# Patient Record
Sex: Female | Born: 1965 | Race: Black or African American | Hispanic: No | Marital: Single | Smoking: Never smoker
Health system: Southern US, Community
[De-identification: ages and names within clinical notes are randomized; demographics above are authoritative.]

## PROBLEM LIST (undated history)

## (undated) DIAGNOSIS — IMO0002 Reserved for concepts with insufficient information to code with codable children: Secondary | ICD-10-CM

## (undated) DIAGNOSIS — I639 Cerebral infarction, unspecified: Secondary | ICD-10-CM

## (undated) DIAGNOSIS — R001 Bradycardia, unspecified: Secondary | ICD-10-CM

## (undated) HISTORY — PX: ANKLE SURGERY: SHX546

## (undated) HISTORY — PX: OTHER SURGICAL HISTORY: SHX169

---

## 1998-11-06 ENCOUNTER — Emergency Department (HOSPITAL_COMMUNITY): Admission: EM | Admit: 1998-11-06 | Discharge: 1998-11-06 | Payer: Self-pay | Admitting: Emergency Medicine

## 1998-11-18 ENCOUNTER — Encounter: Admission: RE | Admit: 1998-11-18 | Discharge: 1998-11-18 | Payer: Self-pay | Admitting: Family Medicine

## 2010-11-26 ENCOUNTER — Other Ambulatory Visit: Payer: Self-pay

## 2010-11-26 ENCOUNTER — Observation Stay (HOSPITAL_COMMUNITY)
Admission: EM | Admit: 2010-11-26 | Discharge: 2010-11-27 | Disposition: A | Discharge status: Home / Self Care | Payer: Self-pay | Attending: Emergency Medicine | Admitting: Emergency Medicine

## 2010-11-26 ENCOUNTER — Encounter: Payer: Self-pay | Admitting: *Deleted

## 2010-11-26 ENCOUNTER — Emergency Department (HOSPITAL_COMMUNITY): Payer: Self-pay

## 2010-11-26 DIAGNOSIS — I4581 Long QT syndrome: Secondary | ICD-10-CM | POA: Insufficient documentation

## 2010-11-26 DIAGNOSIS — R799 Abnormal finding of blood chemistry, unspecified: Secondary | ICD-10-CM | POA: Insufficient documentation

## 2010-11-26 DIAGNOSIS — R51 Headache: Principal | ICD-10-CM | POA: Insufficient documentation

## 2010-11-26 DIAGNOSIS — R072 Precordial pain: Secondary | ICD-10-CM

## 2010-11-26 DIAGNOSIS — R071 Chest pain on breathing: Secondary | ICD-10-CM | POA: Insufficient documentation

## 2010-11-26 HISTORY — DX: Reserved for concepts with insufficient information to code with codable children: IMO0002

## 2010-11-26 LAB — URINALYSIS, ROUTINE W REFLEX MICROSCOPIC
Ketones, ur: NEGATIVE mg/dL
Protein, ur: NEGATIVE mg/dL
Urobilinogen, UA: 1 mg/dL (ref 0.0–1.0)

## 2010-11-26 LAB — COMPREHENSIVE METABOLIC PANEL
ALT: 7 U/L (ref 0–35)
CO2: 25 mEq/L (ref 19–32)
Calcium: 9.7 mg/dL (ref 8.4–10.5)
Creatinine, Ser: 0.97 mg/dL (ref 0.50–1.10)
GFR calc Af Amer: 82 mL/min — ABNORMAL LOW (ref >90)
GFR calc non Af Amer: 71 mL/min — ABNORMAL LOW (ref >90)
Glucose, Bld: 82 mg/dL (ref 70–99)

## 2010-11-26 LAB — DIFFERENTIAL
Basophils Absolute: 0 10*3/uL (ref 0.0–0.1)
Eosinophils Absolute: 0.1 10*3/uL (ref 0.0–0.7)
Lymphs Abs: 1.9 10*3/uL (ref 0.7–4.0)
Neutro Abs: 1.6 10*3/uL — ABNORMAL LOW (ref 1.7–7.7)

## 2010-11-26 LAB — TROPONIN I: Troponin I: <0.3 ng/mL (ref <0.30)

## 2010-11-26 LAB — CK TOTAL AND CKMB (NOT AT ARMC)
CK, MB: 2.3 ng/mL (ref 0.3–4.0)
Relative Index: INVALID (ref 0.0–2.5)
Total CK: 85 U/L (ref 7–177)

## 2010-11-26 LAB — CBC
HCT: 32.1 % — ABNORMAL LOW (ref 36.0–46.0)
MCHC: 29.9 g/dL — ABNORMAL LOW (ref 30.0–36.0)
MCV: 72.3 fL — ABNORMAL LOW (ref 78.0–100.0)
RDW: 20.4 % — ABNORMAL HIGH (ref 11.5–15.5)

## 2010-11-26 LAB — POCT I-STAT TROPONIN I

## 2010-11-26 LAB — URINE MICROSCOPIC-ADD ON

## 2010-11-26 MED ORDER — DIPHENHYDRAMINE HCL 25 MG PO CAPS
25.0000 mg | ORAL_CAPSULE | Freq: Once | ORAL | Status: AC
  Administered 2010-11-26: 25 mg via ORAL
  Filled 2010-11-26: qty 1

## 2010-11-26 MED ORDER — NITROGLYCERIN 0.4 MG SL SUBL
0.4000 mg | SUBLINGUAL_TABLET | SUBLINGUAL | Status: DC | PRN
  Administered 2010-11-26: 0.4 mg via SUBLINGUAL

## 2010-11-26 MED ORDER — METOCLOPRAMIDE HCL 5 MG/ML IJ SOLN
INTRAMUSCULAR | Status: AC
  Administered 2010-11-26: 10 mg via INTRAVENOUS
  Filled 2010-11-26: qty 2

## 2010-11-26 MED ORDER — MORPHINE SULFATE 2 MG/ML IJ SOLN
2.0000 mg | Freq: Once | INTRAMUSCULAR | Status: AC
  Administered 2010-11-26: 2 mg via INTRAVENOUS

## 2010-11-26 MED ORDER — DEXAMETHASONE SODIUM PHOSPHATE 10 MG/ML IJ SOLN
10.0000 mg | Freq: Once | INTRAMUSCULAR | Status: AC
  Administered 2010-11-26: 10 mg via INTRAVENOUS
  Filled 2010-11-26: qty 1

## 2010-11-26 MED ORDER — METOCLOPRAMIDE HCL 5 MG/ML IJ SOLN
10.0000 mg | Freq: Once | INTRAMUSCULAR | Status: AC
  Administered 2010-11-26: 10 mg via INTRAVENOUS

## 2010-11-26 MED ORDER — IOHEXOL 300 MG/ML  SOLN
100.0000 mL | Freq: Once | INTRAMUSCULAR | Status: AC | PRN
  Administered 2010-11-26: 100 mL via INTRAVENOUS

## 2010-11-26 MED ORDER — MORPHINE SULFATE 4 MG/ML IJ SOLN
INTRAMUSCULAR | Status: AC
  Filled 2010-11-26: qty 1

## 2010-11-26 MED ORDER — ASPIRIN 81 MG PO CHEW: 324.0000 mg | CHEWABLE_TABLET | Freq: Once | ORAL | Status: DC

## 2010-11-26 MED ORDER — METOCLOPRAMIDE HCL 5 MG/ML IJ SOLN
10.0000 mg | Freq: Once | INTRAMUSCULAR | Status: AC
  Administered 2010-11-26: 10 mg via INTRAVENOUS
  Filled 2010-11-26: qty 2

## 2010-11-26 MED ORDER — SODIUM CHLORIDE 0.9 % IV SOLN
INTRAVENOUS | Status: DC
  Administered 2010-11-26: 08:00:00 via INTRAVENOUS

## 2010-11-26 NOTE — ED Notes (Signed)
Pt to cdu on cp protocol. C/o substernal cp x 1 day. Denies sob. Pt was c/o h/a earlier but now has resolved. Pt states cp remains 4/10. Pt to have stress test in am. In no acute distress, a&ox3.

## 2010-11-26 NOTE — ED Provider Notes (Signed)
History     CSN: [id number] Arrival date & time: 11/26/2010  6:33 AM   None     Chief Complaint  Patient presents with  . Headache    (Consider location/radiation/quality/duration/timing/severity/associated sxs/prior treatment) HPI Comments: Patient is a 45 year old woman who developed chest pain around 2 AM today. She says the pain is in her left anterior chest as a constant dull ache. She rates the pain as a 7 on the pain scale. There has been no prior episode. She took no medication for this. She says that she's also had a headache in the temples for the past 3 days. The throbbing headache. She has taken Advil for migraine without relief. She's had prior similar episodes.  Patient is a 45 y.o. female presenting with chest pain. The history is provided by the patient. No language interpreter was used.  Chest Pain The chest pain began 3 - 5 hours ago. Episode Length: Patient has a constant dull pain. Chest pain occurs constantly. The chest pain is unchanged. At its most intense, the pain is at 7/10. The severity of the pain is moderate. The quality of the pain is described as aching. The pain does not radiate. She tried nothing for the symptoms. Past medical history comments: Patient has had prior surgery for a bleeding ulcer. Family history comments: Patient's mother had hypertension and diabetes. Her father died of leukemia. She has 3 sisters and 2 brothers living and well.     Past Medical History  Diagnosis Date  . Ulcer     History reviewed. No pertinent past surgical history.  History reviewed. No pertinent family history.  History  Substance Use Topics  . Smoking status: Never Smoker   . Smokeless tobacco: Not on file  . Alcohol Use: No    OB History    Grav Para Term Preterm Abortions TAB SAB Ect Mult Living                  Review of Systems  Constitutional: Negative.   Eyes: Negative.   Respiratory: Negative.   Cardiovascular: Positive for chest pain.    Gastrointestinal: Negative.   Genitourinary: Negative.        She just finished a normal menstrual period.  Musculoskeletal: Negative.   Neurological: Positive for headaches.  Psychiatric/Behavioral: Negative.     Allergies  Review of patient's allergies indicates no known allergies.  Home Medications  No current outpatient prescriptions on file.  BP 96/53  Pulse 84  Temp(Src) 98.2 F (36.8 C) (Oral)  Resp 20  SpO2 100%  Physical Exam  Constitutional: She is oriented to person, place, and time. She appears well-developed and well-nourished. Distressed: in moderate distress with chest pain.  HENT:  Head: Normocephalic and atraumatic.  Mouth/Throat: No oropharyngeal exudate.       She localizes her headache to the temporal areas. There is no palpable skull the formerly. There is no point tenderness over the temporal arteries.  Eyes: Conjunctivae and EOM are normal. Pupils are equal, round, and reactive to light. No scleral icterus.  Neck: Normal range of motion. Neck supple. No thyromegaly present.  Cardiovascular: Normal rate, regular rhythm and normal heart sounds.   Pulmonary/Chest: Effort normal and breath sounds normal.  Abdominal: Soft. Bowel sounds are normal.  Musculoskeletal: Normal range of motion.       No calf tenderness. No Homans sign.  Lymphadenopathy:    She has no cervical adenopathy.  Neurological: She is alert and oriented to person, place, and time.  She has normal reflexes.       No sensory or motor deficits.  Skin: Skin is warm and dry.  Psychiatric: She has a normal mood and affect. Her behavior is normal.    ED Course  Procedures (including critical care time)  7:20 PM Patient was seen and had physical examination, which was entirely normal. Electrocardiogram showed ventricular bigeminy. There is no acute change. Laboratory tests and chest x-ray were ordered. IV fluids, oxygen, sublingual nitroglycerin were ordered.   Date: 11/26/2010  Rate:72   Rhythm: sinus tachycardia with bigeminy  QRS Axis: normal  Intervals: QT prolonged  ST/T Wave abnormalities: normal  Conduction Disutrbances:none  Narrative Interpretation: Abnormal EKG, ventricular bigeminy.  Old EKG Reviewed: Had ventricular bigeminy today.  7:20 PM Troponin I was negative. D-dimer was elevated at 1.32. CT angiogram chest was ordered to check for pulmonary embolism.  7:20 PM CT angiogram chest was negative. Patient was informed of this. She continues with chest pain and headache. I will give her migraine cocktail of Reglan, dexamethasone, and Benadryl. The three-hour troponin I was ordered also.  7:20 PM Waiting for 3 hour TNI result.  7:20 PM Second TNI is back and is negative.  She still rates her pain at a 4.  Will move to CDU observation for chest pain. I discussed this with pt and her husband and they are agreeable with this approach.   Results for orders placed during the hospital encounter of 11/26/10  CBC      Component Value Range   WBC 3.9 (*) 4.0 - 10.5 (K/uL)   RBC 4.44  3.87 - 5.11 (MIL/uL)   Hemoglobin 9.6 (*) 12.0 - 15.0 (g/dL)   HCT 16.1 (*) 09.6 - 46.0 (%)   MCV 72.3 (*) 78.0 - 100.0 (fL)   MCH 21.6 (*) 26.0 - 34.0 (pg)   MCHC 29.9 (*) 30.0 - 36.0 (g/dL)   RDW 04.5 (*) 40.9 - 15.5 (%)   Platelets 254  150 - 400 (K/uL)  DIFFERENTIAL      Component Value Range   Neutrophils Relative 42 (*) 43 - 77 (%)   Lymphocytes Relative 47 (*) 12 - 46 (%)   Monocytes Relative 8  3 - 12 (%)   Eosinophils Relative 2  0 - 5 (%)   Basophils Relative 1  0 - 1 (%)   Neutro Abs 1.6 (*) 1.7 - 7.7 (K/uL)   Lymphs Abs 1.9  0.7 - 4.0 (K/uL)   Monocytes Absolute 0.3  0.1 - 1.0 (K/uL)   Eosinophils Absolute 0.1  0.0 - 0.7 (K/uL)   Basophils Absolute 0.0  0.0 - 0.1 (K/uL)   RBC Morphology ELLIPTOCYTES     WBC Morphology ATYPICAL LYMPHOCYTES     Smear Review PLATELETS APPEAR ADEQUATE    COMPREHENSIVE METABOLIC PANEL      Component Value Range   Sodium 138  135  - 145 (mEq/L)   Potassium 4.0  3.5 - 5.1 (mEq/L)   Chloride 106  96 - 112 (mEq/L)   CO2 25  19 - 32 (mEq/L)   Glucose, Bld 82  70 - 99 (mg/dL)   BUN 16  6 - 23 (mg/dL)   Creatinine, Ser 8.11  0.50 - 1.10 (mg/dL)   Calcium 9.7  8.4 - 91.4 (mg/dL)   Total Protein 7.6  6.0 - 8.3 (g/dL)   Albumin 3.4 (*) 3.5 - 5.2 (g/dL)   AST 19  0 - 37 (U/L)   ALT 7  0 - 35 (U/L)  Alkaline Phosphatase 70  39 - 117 (U/L)   Total Bilirubin 0.3  0.3 - 1.2 (mg/dL)   GFR calc non Af Amer 71 (*) >90 (mL/min)   GFR calc Af Amer 82 (*) >90 (mL/min)  D-DIMER, QUANTITATIVE      Component Value Range   D-Dimer, Quant 1.32 (*) 0.00 - 0.48 (ug/mL-FEU)  URINALYSIS, ROUTINE W REFLEX MICROSCOPIC      Component Value Range   Color, Urine YELLOW  YELLOW    Appearance CLEAR  CLEAR    Specific Gravity, Urine 1.017  1.005 - 1.030    pH 5.5  5.0 - 8.0    Glucose, UA NEGATIVE  NEGATIVE (mg/dL)   Hgb urine dipstick SMALL (*) NEGATIVE    Bilirubin Urine NEGATIVE  NEGATIVE    Ketones, ur NEGATIVE  NEGATIVE (mg/dL)   Protein, ur NEGATIVE  NEGATIVE (mg/dL)   Urobilinogen, UA 1.0  0.0 - 1.0 (mg/dL)   Nitrite NEGATIVE  NEGATIVE    Leukocytes, UA SMALL (*) NEGATIVE   CK TOTAL AND CKMB      Component Value Range   Total CK 85  7 - 177 (U/L)   CK, MB 2.3  0.3 - 4.0 (ng/mL)   Relative Index RELATIVE INDEX IS INVALID  0.0 - 2.5   URINE MICROSCOPIC-ADD ON      Component Value Range   Squamous Epithelial / LPF FEW (*) RARE    WBC, UA 3-6  <3 (WBC/hpf)   Bacteria, UA RARE  RARE   POCT I-STAT TROPONIN I      Component Value Range   Troponin i, poc 0.00  0.00 - 0.08 (ng/mL)   Comment 3           POCT I-STAT TROPONIN I      Component Value Range   Troponin i, poc 0.00  0.00 - 0.08 (ng/mL)   Comment 3           TROPONIN I      Component Value Range   Troponin I <0.30  <0.30 (ng/mL)   Ct Angio Chest W/cm &/or Wo Cm  11/26/2010  *RADIOLOGY REPORT*  Clinical Data:  Left-sided chest pain, elevated D-dimer  CT ANGIOGRAPHY  CHEST WITH CONTRAST  Technique:  Multidetector CT imaging of the chest was performed using the standard protocol during bolus administration of intravenous contrast.  Multiplanar CT image reconstructions including MIPs were obtained to evaluate the vascular anatomy.  Contrast: OMNIPAQUE IOHEXOL 300 MG/ML IV SOLN  Comparison:  Portable chest x-ray of 11/26/2010  Findings:  The pulmonary arteries opacify well and there is no evidence of acute pulmonary embolism.  The thoracic aorta is not as well opacified but no acute abnormality is seen.  No mediastinal or hilar adenopathy is seen.  No axillary adenopathy is noted.  On the lung window images, no lung parenchymal abnormality is seen. No lung nodule is noted.  There is no evidence of pleural effusion. No bony abnormality is seen.  Review of the MIP images confirms the above findings.  IMPRESSION: Negative CT angiogram of the chest.  No evidence of acute pulmonary embolism.  Original Report Authenticated By: Juline Patch, M.D.   Dg Chest Port 1 View  11/26/2010  *RADIOLOGY REPORT*  Clinical Data: Left-sided chest pain today  PORTABLE CHEST - 1 VIEW  Comparison: None.  Findings: The lungs are clear.  Mediastinal contours appear normal. The heart is within normal limits in size.  No bony abnormality is seen.  IMPRESSION: No  active lung disease.  Original Report Authenticated By: Juline Patch, M.D.   1. Chest pain            Carleene Cooper III, MD 11/26/10 [phone]

## 2010-11-26 NOTE — ED Notes (Signed)
Pt reports no relief after nitro. Blood pressure <100 will not give 2nd nitro.

## 2010-11-26 NOTE — ED Notes (Signed)
Pt reports headache and chest pain 6/10, edp is aware of meds given, pt remains on monitor

## 2010-11-26 NOTE — ED Notes (Signed)
Patient presents to ed c/o headache onset 4 days ago, states she was awakened with chest pain this am . Pain radiates across her anterior chest. No change with movement or breathing.

## 2010-11-26 NOTE — ED Notes (Signed)
Pt presents to department for evaluation of throbbing headache and nausea x4 days. No relief with medications at home. Also states she woke up with L sided chest pain radiating to L arm around 02:00 this morning. Nothing makes pain worse. Denies SOB. Respirations unlabored. 4/10 pain at the time. Ambulatory without difficulty. Pt is conscious alert and oriented x4. No signs of distress at the time.

## 2010-11-26 NOTE — ED Notes (Signed)
Pt reports left sided chest pain associated with headache, pain is non radiating. Pt remains on monitor. Aware of poc. Family at bedside.

## 2010-11-26 NOTE — ED Provider Notes (Signed)
5:11 PM Patient is in CDU under observation - chest pain protocol.  Patient reports 3/10 aching chest pain and nausea/vomiting and headache.  States the headache was exacerbated by the nitroglycerin.  Patient states understanding of plan for stress test in the morning.  On exam, pt is A&Ox4, NAD, RRR, no m/r/g, CTAB, abd soft, nondistended, nontender, no guarding, no rebound, lower extremities with mild edema, distal pulses in all extremities intact and equal bilaterally.  I have ordered pain and nausea medication.  Pt remains under observation.    11:28 PM Patient resting comfortably. Denies CP.  No needs at this time.    11:55 PM Dr Read Drivers assumes care of patient at change of shift.  Plan is for exercise stress test in the morning.    Rise Patience, [place] 11/26/10 2355   Medical screening examination/treatment/procedure(s) were performed by non-physician practitioner and as supervising physician I was immediately available for consultation/collaboration. Osvaldo Human, M.D.   Carleene Cooper III, MD 11/28/10 [phone]

## 2010-11-26 NOTE — ED Notes (Signed)
Pt requesting something to eat will speak with EDP

## 2010-11-26 NOTE — ED Notes (Signed)
Pt reports chest pain 4/10 with mild nausea. Denies headache. Aware of poc. Family at bedside, remains on monitor.

## 2010-11-26 NOTE — ED Notes (Signed)
Pt nauseated, states throwing up in bathroom. PA aware.

## 2010-11-26 NOTE — ED Notes (Signed)
Pt resting comfortably, remains on monitor, family at bedside. Pt reports discomfort 1/10 at this time, no needs

## 2010-11-26 NOTE — ED Notes (Signed)
Lab is aware of delay in draw of troponin

## 2010-11-27 ENCOUNTER — Other Ambulatory Visit: Payer: Self-pay

## 2010-11-27 DIAGNOSIS — R072 Precordial pain: Secondary | ICD-10-CM

## 2010-11-27 LAB — URINE CULTURE

## 2010-11-27 NOTE — ED Notes (Signed)
Procedure explained to pt. Questions answered. Family at bedside. Sinus rhythm on monitor. Pt given supplies to do adls

## 2010-11-27 NOTE — ED Notes (Signed)
Stress echo scheduled via 7500

## 2010-11-27 NOTE — ED Notes (Signed)
Pt continues out of dept in vascular lab

## 2010-11-27 NOTE — ED Notes (Signed)
Patient has been given snacks per her request. She does not wish to leave the cdu until she recieves her lunch tray. Have spoken with service response to check on why food has not been delivered. Tray was ordered an hour ago

## 2010-11-27 NOTE — Progress Notes (Signed)
Observation review is complete. 

## 2010-11-27 NOTE — ED Provider Notes (Signed)
History     CSN: [id number] Arrival date & time: 11/26/2010  6:33 AM   First MD Initiated Contact with Patient 11/26/10 [phone]      Chief Complaint  Patient presents with  . Headache    (Consider location/radiation/quality/duration/timing/severity/associated sxs/prior treatment) HPI  Past Medical History  Diagnosis Date  . Ulcer     History reviewed. No pertinent past surgical history.  History reviewed. No pertinent family history.  History  Substance Use Topics  . Smoking status: Never Smoker   . Smokeless tobacco: Not on file  . Alcohol Use: No    OB History    Grav Para Term Preterm Abortions TAB SAB Ect Mult Living                  Review of Systems  Allergies  Review of patient's allergies indicates no known allergies.  Home Medications  No current outpatient prescriptions on file.  BP 112/74  Pulse 66  Temp(Src) 98.1 F (36.7 C) (Oral)  Resp 16  Ht 5\' 6"  (1.676 m)  Wt 195 lb (88.451 kg)  BMI 31.47 kg/m2  SpO2 99%  Physical Exam  ED Course  Procedures (including critical care time)  Labs Reviewed  CBC - Abnormal; Notable for the following:    WBC 3.9 (*)    Hemoglobin 9.6 (*)    HCT 32.1 (*)    MCV 72.3 (*)    MCH 21.6 (*)    MCHC 29.9 (*)    RDW 20.4 (*)    All other components within normal limits  DIFFERENTIAL - Abnormal; Notable for the following:    Neutrophils Relative 42 (*)    Lymphocytes Relative 47 (*)    Neutro Abs 1.6 (*)    All other components within normal limits  COMPREHENSIVE METABOLIC PANEL - Abnormal; Notable for the following:    Albumin 3.4 (*)    GFR calc non Af Amer 71 (*)    GFR calc Af Amer 82 (*)    All other components within normal limits  D-DIMER, QUANTITATIVE - Abnormal; Notable for the following:    D-Dimer, Quant 1.32 (*)    All other components within normal limits  URINALYSIS, ROUTINE W REFLEX MICROSCOPIC - Abnormal; Notable for the following:    Hgb urine dipstick SMALL (*)    Leukocytes, UA  SMALL (*)    All other components within normal limits  URINE MICROSCOPIC-ADD ON - Abnormal; Notable for the following:    Squamous Epithelial / LPF FEW (*)    All other components within normal limits  CK TOTAL AND CKMB  POCT I-STAT TROPONIN I  POCT I-STAT TROPONIN I  TROPONIN I  I-STAT TROPONIN I  URINE CULTURE  I-STAT TROPONIN I   Ct Angio Chest W/cm &/or Wo Cm  11/26/2010  *RADIOLOGY REPORT*  Clinical Data:  Left-sided chest pain, elevated D-dimer  CT ANGIOGRAPHY CHEST WITH CONTRAST  Technique:  Multidetector CT imaging of the chest was performed using the standard protocol during bolus administration of intravenous contrast.  Multiplanar CT image reconstructions including MIPs were obtained to evaluate the vascular anatomy.  Contrast: OMNIPAQUE IOHEXOL 300 MG/ML IV SOLN  Comparison:  Portable chest x-ray of 11/26/2010  Findings:  The pulmonary arteries opacify well and there is no evidence of acute pulmonary embolism.  The thoracic aorta is not as well opacified but no acute abnormality is seen.  No mediastinal or hilar adenopathy is seen.  No axillary adenopathy is noted.  On the lung  window images, no lung parenchymal abnormality is seen. No lung nodule is noted.  There is no evidence of pleural effusion. No bony abnormality is seen.  Review of the MIP images confirms the above findings.  IMPRESSION: Negative CT angiogram of the chest.  No evidence of acute pulmonary embolism.  Original Report Authenticated By: Juline Patch, M.D.   Dg Chest Port 1 View  11/26/2010  *RADIOLOGY REPORT*  Clinical Data: Left-sided chest pain today  PORTABLE CHEST - 1 VIEW  Comparison: None.  Findings: The lungs are clear.  Mediastinal contours appear normal. The heart is within normal limits in size.  No bony abnormality is seen.  IMPRESSION: No active lung disease.  Original Report Authenticated By: Juline Patch, M.D.     1. Chest pain    7:11 AM Handoff from Dr. Read Drivers at 0700. Pt awaiting  exercise stress test.  Patient has been chest pain free overnight.  Exam:  Gen NAD; Heart RRR, nml S1,S2, no m/r/g; Lungs CTAB; Abd soft, NT, no rebound or guarding; Ext 2+ pedal pulses bilaterally, no edema.  11:00 AM Cardiologist called and stated that stress test was negative. Patient informed. Will d/c to home with PCP referral.  11:02 AM Patient informed of results. Will discharge to home. Patient told to return to ED with persistent CP associated with exertion, sweating, racing heart, palpitations, shortness of breath, lightheadedness, radiation of pain into jaw, neck, or arms.  Patient verbalizes understanding and agrees with plan.     MDM  Neg stress echo, chest pain free, work-up does not indicate cardiac cause of pain.    Medical screening examination/treatment/procedure(s) were performed by non-physician practitioner and as supervising physician I was immediately available for consultation/collaboration. Osvaldo Human, M.D.    Eustace Moore [place], [place] 11/27/10 1103  Carleene Cooper III, MD 11/28/10 [phone]

## 2010-11-27 NOTE — Progress Notes (Signed)
  Echocardiogram Echocardiogram Stress Test has been performed.  Juanita Laster Collin Hendley, RDCS 11/27/2010, 9:40 AM

## 2010-11-27 NOTE — ED Notes (Signed)
Pt requests a chicken salad with ranch when she is able to eat. Called and ordered for pt

## 2010-11-27 NOTE — ED Notes (Signed)
Pt remains CP free and continues on monitor. Will continue to monitor.

## 2010-11-27 NOTE — ED Notes (Signed)
Patient denies pain and is resting comfortably.  

## 2012-03-11 ENCOUNTER — Emergency Department (HOSPITAL_COMMUNITY)
Admission: EM | Admit: 2012-03-11 | Discharge: 2012-03-11 | Disposition: A | Discharge status: Home / Self Care | Payer: Self-pay | Attending: Emergency Medicine | Admitting: Emergency Medicine

## 2012-03-11 ENCOUNTER — Encounter (HOSPITAL_COMMUNITY): Payer: Self-pay | Admitting: Emergency Medicine

## 2012-03-11 DIAGNOSIS — M5430 Sciatica, unspecified side: Secondary | ICD-10-CM | POA: Insufficient documentation

## 2012-03-11 DIAGNOSIS — M5431 Sciatica, right side: Secondary | ICD-10-CM

## 2012-03-11 DIAGNOSIS — Z8711 Personal history of peptic ulcer disease: Secondary | ICD-10-CM | POA: Insufficient documentation

## 2012-03-11 MED ORDER — PREDNISONE 20 MG PO TABS: ORAL_TABLET | ORAL | Status: DC

## 2012-03-11 MED ORDER — KETOROLAC TROMETHAMINE 60 MG/2ML IM SOLN
60.0000 mg | Freq: Once | INTRAMUSCULAR | Status: AC
  Administered 2012-03-11: 60 mg via INTRAMUSCULAR
  Filled 2012-03-11: qty 2

## 2012-03-11 MED ORDER — CYCLOBENZAPRINE HCL 10 MG PO TABS: 10.0000 mg | ORAL_TABLET | Freq: Two times a day (BID) | ORAL | Status: DC | PRN

## 2012-03-11 MED ORDER — TRAMADOL HCL 50 MG PO TABS: 50.0000 mg | ORAL_TABLET | Freq: Four times a day (QID) | ORAL | Status: DC | PRN

## 2012-03-11 NOTE — ED Provider Notes (Signed)
History     CSN: [id number]  Arrival date & time 03/11/12  0904   First MD Initiated Contact with Patient 03/11/12 [phone]      Chief Complaint  Patient presents with  . Back Pain    (Consider location/radiation/quality/duration/timing/severity/associated sxs/prior treatment) HPI Comments: Patient presents to the ER for evaluation of low back pain that began 3 days ago. Pain is in the right side of the lower back it radiates down the back of the leg. She denies injury. Pain is sharp, stabbing and severe. It is worse today that has been, has gradually worsened. She has not had any urinary symptoms. No change in bowel or bladder function. Patient denies weakness, numbness and tingling in the lower extremity.  Patient is a 47 y.o. female presenting with back pain.  Back Pain   Past Medical History  Diagnosis Date  . Ulcer     Past Surgical History  Procedure Laterality Date  . Ulcer surgery    . Ankle surgery      No family history on file.  History  Substance Use Topics  . Smoking status: Never Smoker   . Smokeless tobacco: Not on file  . Alcohol Use: Yes    OB History   Grav Para Term Preterm Abortions TAB SAB Ect Mult Living                  Review of Systems  Genitourinary: Negative.   Musculoskeletal: Positive for back pain.  Neurological: Negative.   All other systems reviewed and are negative.    Allergies  Review of patient's allergies indicates no known allergies.  Home Medications   Current Outpatient Rx  Name  Route  Sig  Dispense  Refill  . Ibuprofen (MIDOL PO)   Oral   Take 2 tablets by mouth daily as needed (cramps).           BP 120/53  Pulse 40  Temp(Src) 98 F (36.7 C) (Oral)  Resp 18  Ht 5\' 6"  (1.676 m)  Wt 230 lb (104.327 kg)  BMI 37.14 kg/m2  SpO2 100%  LMP 03/10/2012  Physical Exam  Constitutional: She is oriented to person, place, and time. She appears distressed.  Eyes: Pupils are equal, round, and reactive to light.    Neck: Normal range of motion. Neck supple.  Cardiovascular: Regular rhythm and normal heart sounds.   Pulmonary/Chest: Effort normal.  Abdominal: Soft.  Musculoskeletal:       Right hip: She exhibits decreased range of motion.       Back:  Decreased range of motion at the hip secondary to painful inhibition. Positive straight leg raise on the right  Neurological: She is alert and oriented to person, place, and time. She has normal strength. No cranial nerve deficit or sensory deficit.  Reflex Scores:      Patellar reflexes are 1+ on the right side and 1+ on the left side.   ED Course  Procedures (including critical care time)   Date: 03/11/2012  Rate: 91  Rhythm: Sinus rhythm with ventricular bigeminy  QRS Axis: normal  Intervals: normal  ST/T Wave abnormalities: normal  Conduction Disutrbances:none  Narrative Interpretation:   Old EKG Reviewed: none available    Labs Reviewed - No data to display No results found.   Diagnosis: Sciatica    MDM  Patient presents to the ER for evaluation of 3 days of progressively worsening right lower back pain radiating down the leg. She has not had any trauma.  She has normal neurologic function. Patient was noted to be bradycardic by monitor, but EKG shows ventricular bigeminy. She is refusing all PVCs which is causing the erroneous bradycardia alert. Blood pressure is normal. Patient not experiencing any chest pain or shortness of breath. Patient will be treated for acute sciatica.       Gilda Crease, MD 03/11/12 [phone]

## 2012-03-11 NOTE — ED Notes (Signed)
Patient claims R lower back pain that radiates down R leg.  Patient advised that it started x 3 days ago.

## 2012-03-15 ENCOUNTER — Encounter (HOSPITAL_COMMUNITY): Payer: Self-pay | Admitting: *Deleted

## 2012-03-15 ENCOUNTER — Emergency Department (HOSPITAL_COMMUNITY): Payer: Self-pay

## 2012-03-15 ENCOUNTER — Emergency Department (HOSPITAL_COMMUNITY)
Admission: EM | Admit: 2012-03-15 | Discharge: 2012-03-15 | Disposition: A | Discharge status: Home / Self Care | Payer: Self-pay | Attending: Emergency Medicine | Admitting: Emergency Medicine

## 2012-03-15 DIAGNOSIS — S59909A Unspecified injury of unspecified elbow, initial encounter: Secondary | ICD-10-CM | POA: Insufficient documentation

## 2012-03-15 DIAGNOSIS — T7492XA Unspecified child maltreatment, confirmed, initial encounter: Secondary | ICD-10-CM | POA: Insufficient documentation

## 2012-03-15 DIAGNOSIS — Z872 Personal history of diseases of the skin and subcutaneous tissue: Secondary | ICD-10-CM | POA: Insufficient documentation

## 2012-03-15 DIAGNOSIS — S6990XA Unspecified injury of unspecified wrist, hand and finger(s), initial encounter: Secondary | ICD-10-CM | POA: Insufficient documentation

## 2012-03-15 DIAGNOSIS — T7491XA Unspecified adult maltreatment, confirmed, initial encounter: Secondary | ICD-10-CM | POA: Insufficient documentation

## 2012-03-15 DIAGNOSIS — IMO0002 Reserved for concepts with insufficient information to code with codable children: Secondary | ICD-10-CM | POA: Insufficient documentation

## 2012-03-15 DIAGNOSIS — S0993XA Unspecified injury of face, initial encounter: Secondary | ICD-10-CM | POA: Insufficient documentation

## 2012-03-15 DIAGNOSIS — S4980XA Other specified injuries of shoulder and upper arm, unspecified arm, initial encounter: Secondary | ICD-10-CM | POA: Insufficient documentation

## 2012-03-15 DIAGNOSIS — S46909A Unspecified injury of unspecified muscle, fascia and tendon at shoulder and upper arm level, unspecified arm, initial encounter: Secondary | ICD-10-CM | POA: Insufficient documentation

## 2012-03-15 MED ORDER — OXYCODONE-ACETAMINOPHEN 5-325 MG PO TABS
2.0000 | ORAL_TABLET | Freq: Once | ORAL | Status: AC
  Administered 2012-03-15: 2 via ORAL
  Filled 2012-03-15: qty 2

## 2012-03-15 MED ORDER — HYDROCODONE-ACETAMINOPHEN 5-325 MG PO TABS: 2.0000 | ORAL_TABLET | Freq: Four times a day (QID) | ORAL | Status: DC | PRN
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
## 2012-03-16 NOTE — ED Provider Notes (Signed)
Medical screening examination/treatment/procedure(s) were performed by non-physician practitioner and as supervising physician I was immediately available for consultation/collaboration.  Derwood Kaplan, MD 03/16/12 [phone]

## 2012-06-15 ENCOUNTER — Emergency Department (HOSPITAL_COMMUNITY)
Admission: EM | Admit: 2012-06-15 | Discharge: 2012-06-15 | Disposition: A | Discharge status: Home / Self Care | Payer: Self-pay | Attending: Emergency Medicine | Admitting: Emergency Medicine

## 2012-06-15 ENCOUNTER — Encounter (HOSPITAL_COMMUNITY): Payer: Self-pay | Admitting: *Deleted

## 2012-06-15 DIAGNOSIS — T481X4A Poisoning by skeletal muscle relaxants [neuromuscular blocking agents], undetermined, initial encounter: Secondary | ICD-10-CM | POA: Insufficient documentation

## 2012-06-15 DIAGNOSIS — Z3202 Encounter for pregnancy test, result negative: Secondary | ICD-10-CM | POA: Insufficient documentation

## 2012-06-15 DIAGNOSIS — T39094A Poisoning by salicylates, undetermined, initial encounter: Secondary | ICD-10-CM | POA: Insufficient documentation

## 2012-06-15 DIAGNOSIS — F4321 Adjustment disorder with depressed mood: Secondary | ICD-10-CM | POA: Insufficient documentation

## 2012-06-15 DIAGNOSIS — Z872 Personal history of diseases of the skin and subcutaneous tissue: Secondary | ICD-10-CM | POA: Insufficient documentation

## 2012-06-15 DIAGNOSIS — M549 Dorsalgia, unspecified: Secondary | ICD-10-CM | POA: Insufficient documentation

## 2012-06-15 DIAGNOSIS — T394X2A Poisoning by antirheumatics, not elsewhere classified, intentional self-harm, initial encounter: Secondary | ICD-10-CM | POA: Insufficient documentation

## 2012-06-15 DIAGNOSIS — T50992A Poisoning by other drugs, medicaments and biological substances, intentional self-harm, initial encounter: Secondary | ICD-10-CM | POA: Insufficient documentation

## 2012-06-15 DIAGNOSIS — G8929 Other chronic pain: Secondary | ICD-10-CM | POA: Insufficient documentation

## 2012-06-15 DIAGNOSIS — Z8679 Personal history of other diseases of the circulatory system: Secondary | ICD-10-CM | POA: Insufficient documentation

## 2012-06-15 HISTORY — DX: Bradycardia, unspecified: R00.1

## 2012-06-15 LAB — COMPREHENSIVE METABOLIC PANEL
AST: 17 U/L (ref 0–37)
CO2: 23 mEq/L (ref 19–32)
Calcium: 9.3 mg/dL (ref 8.4–10.5)
Creatinine, Ser: 0.96 mg/dL (ref 0.50–1.10)
GFR calc Af Amer: 82 mL/min — ABNORMAL LOW (ref >90)
GFR calc non Af Amer: 70 mL/min — ABNORMAL LOW (ref >90)
Glucose, Bld: 100 mg/dL — ABNORMAL HIGH (ref 70–99)

## 2012-06-15 LAB — CBC
MCH: 22.3 pg — ABNORMAL LOW (ref 26.0–34.0)
MCHC: 31.3 g/dL (ref 30.0–36.0)
MCV: 71.3 fL — ABNORMAL LOW (ref 78.0–100.0)
Platelets: 283 10*3/uL (ref 150–400)
RBC: 4.43 MIL/uL (ref 3.87–5.11)

## 2012-06-15 LAB — ACETAMINOPHEN LEVEL: Acetaminophen (Tylenol), Serum: <15 ug/mL (ref 10–30)

## 2012-06-15 LAB — GLUCOSE, CAPILLARY: Glucose-Capillary: 86 mg/dL (ref 70–99)

## 2012-06-15 LAB — SALICYLATE LEVEL: Salicylate Lvl: 7.3 mg/dL (ref 2.8–20.0)

## 2012-06-15 MED ORDER — LACTATED RINGERS IV SOLN: INTRAVENOUS | Status: DC

## 2012-06-15 MED ORDER — ALUM & MAG HYDROXIDE-SIMETH 200-200-20 MG/5ML PO SUSP
30.0000 mL | Freq: Once | ORAL | Status: AC
  Administered 2012-06-15: 30 mL via ORAL
  Filled 2012-06-15: qty 30

## 2012-06-15 MED ORDER — SODIUM CHLORIDE 0.9 % IV BOLUS (SEPSIS)
1000.0000 mL | Freq: Once | INTRAVENOUS | Status: AC
  Administered 2012-06-15: 1000 mL via INTRAVENOUS

## 2012-06-15 NOTE — ED Notes (Signed)
Per EDP, pt does not require a sitter at this time.  Family at bedside.

## 2012-06-15 NOTE — ED Notes (Signed)
HR irregular, 40's, pt responsive to painful stimuli, per EMS FS 77

## 2012-06-15 NOTE — ED Notes (Signed)
Pt requesting muscle relaxers for spasms in back and legs.  Reinforced with pt since she reported taking overdose of muscle relaxers, there is already more than recommended daily dosage in her system and additional medications wouldn't be safe.  Pt unhappy, but understands rationale.

## 2012-06-15 NOTE — ED Notes (Signed)
Pt reports she took the 8 ASA, and states she took 6 muscle relaxers that a friend gave her.  Pt unsure of name of medication.  Pt also reports that she was previously diagnosed at [facility] hospital with "a slow irregular heartbeat."  Denies any additional needs at present time.  Environment secured and sitter at bedside.  Pt on cardiac monitor.
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
## 2012-06-16 NOTE — ED Provider Notes (Addendum)
History     CSN: [id number]  Arrival date & time 06/15/12  0037   First MD Initiated Contact with Patient 06/15/12 0106      Chief Complaint  Patient presents with  . Drug Overdose    HPI patient broke up with her boyfriend earlier this evening says she took 7 or 8 muscle relaxers from her friend, and she also took 8 aspirin.  She denies any suicidal or homicidal ideation. She does say that she is chronic back pain and takes muscle relaxers frequently for this. She says she did not take the medicines to hurt herself. She says "nobody is worth that" she denies any homicidal ideations, delusions, hallucinations. She currently denies any pain at this time besides her chronic back pain.  She says the pain is in the back, lumbar region, moderate to severe, spasms, no associated nausea, vomiting, loss of sensation in the lower extremities, no dysuria, no frequency, no loss of bowel or bladder function.  Past Medical History  Diagnosis Date  . Ulcer   . Bradycardia     Past Surgical History  Procedure Laterality Date  . Ulcer surgery    . Ankle surgery      History reviewed. No pertinent family history.  History  Substance Use Topics  . Smoking status: Never Smoker   . Smokeless tobacco: Not on file  . Alcohol Use: Yes    OB History   Grav Para Term Preterm Abortions TAB SAB Ect Mult Living                  Review of Systems At least 10pt or greater review of systems completed and are negative except where specified in the HPI.  Allergies  Review of patient's allergies indicates no known allergies.  Home Medications   Current Outpatient Rx  Name  Route  Sig  Dispense  Refill  . cyclobenzaprine (FLEXERIL) 10 MG tablet   Oral   Take 1 tablet (10 mg total) by mouth 2 (two) times daily as needed for muscle spasms.   20 tablet   0   . HYDROcodone-acetaminophen (NORCO/VICODIN) 5-325 MG per tablet   Oral   Take 2 tablets by mouth every 6 (six) hours as needed for pain.  12 tablet   0   . predniSONE (DELTASONE) 20 MG tablet      3 tabs po daily x 3 days, then 2 tabs x 3 days, then 1.5 tabs x 3 days, then 1 tab x 3 days, then 0.5 tabs x 3 days   27 tablet   0   . traMADol (ULTRAM) 50 MG tablet   Oral   Take 1 tablet (50 mg total) by mouth every 6 (six) hours as needed for pain.   15 tablet   0     BP 128/81  Pulse 72  Temp(Src) 98.8 F (37.1 C) (Oral)  Ht 5\' 5"  (1.651 m)  Wt 230 lb (104.327 kg)  BMI 38.27 kg/m2  SpO2 98%  Physical Exam  Nursing notes reviewed.  Electronic medical record reviewed. VITAL SIGNS:   Filed Vitals:   06/15/12 0030 06/15/12 0229  BP: 136/70 128/81  Pulse: 44 72  Temp: 98.8 F (37.1 C)   TempSrc: Oral   Height: 5\' 5"  (1.651 m)   Weight: 230 lb (104.327 kg)   SpO2: 98% 98%   CONSTITUTIONAL: Awake, oriented, appears non-toxic HENT: Atraumatic, normocephalic, oral mucosa pink and moist, airway patent. Nares patent without drainage. External ears normal. EYES:  Conjunctiva clear, EOMI, PERRLA NECK: Trachea midline, non-tender, supple CARDIOVASCULAR: Normal heart rate, Normal rhythm, No murmurs, rubs, gallops PULMONARY/CHEST: Clear to auscultation, no rhonchi, wheezes, or rales. Symmetrical breath sounds. Non-tender. ABDOMINAL: Non-distended, soft, non-tender - no rebound or guarding.  BS normal. NEUROLOGIC: Non-focal, moving all four extremities, no gross sensory or motor deficits. EXTREMITIES: No clubbing, cyanosis, or edema SKIN: Warm, Dry, No erythema, No rash Psychiatric: Euthymic, mood congruent with affect, does not appear depressed - somewhat appropriately upset with her boyfriend, judgment is normal, not responding to internal stimuli, good eye contact, responds to questions appropriately ED Course  Procedures (including critical care time)  Date: 06/16/2012  Rate: Sinus rhythm with frequent immature ventricular complexes-bigeminy  Rhythm: normal sinus rhythm  QRS Axis: normal  Intervals: normal   ST/T Wave abnormalities: normal  Conduction Disutrbances: none  Narrative Interpretation: Patient in bigeminy, this is the exact same rhythm she was in seen on prior EKG from 03/11/2012  Labs Reviewed  CBC - Abnormal; Notable for the following:    Hemoglobin 9.9 (*)    HCT 31.6 (*)    MCV 71.3 (*)    MCH 22.3 (*)    RDW 20.5 (*)    All other components within normal limits  COMPREHENSIVE METABOLIC PANEL - Abnormal; Notable for the following:    Glucose, Bld 100 (*)    Total Bilirubin 0.2 (*)    GFR calc non Af Amer 70 (*)    GFR calc Af Amer 82 (*)    All other components within normal limits  URINE RAPID DRUG SCREEN (HOSP PERFORMED) - Abnormal; Notable for the following:    Cocaine POSITIVE (*)    All other components within normal limits  ETHANOL  ACETAMINOPHEN LEVEL  SALICYLATE LEVEL  GLUCOSE, CAPILLARY  POCT PREGNANCY, URINE   No results found.   1. Adjustment disorder with depressed mood       MDM  ADRINA ARMIJO is a 47 y.o. female presenting with nonlethal overdose of aspirin and muscle relaxers-patient typically takes muscle relaxers for her low back pain.  Check labs which were unremarkable, aspirin is therapeutic.  No acidosis or alkalosis. No alcohol.  Patient is positive for cocaine, vital signs are stable and within normal limits. Patient is able to contract for safety after a period of observation in the ER, she is not suicidal or homicidal.  I think she retains fair to good judgment, do not think this patient requires a psychiatric evaluation at this time. The patient followup with a mark for cocaine abuse as well as adjustment disorder following breaking up with her boyfriend, followup with primary care physician as well. Return to the ER for any worsening depression or suicidal thoughts. She understands accepts medical plan as it's been dictated and degrees of that, I have answered all of her questions.         Jones Skene, MD 06/16/12 0454  Jones Skene, MD 06/16/12 0981

## 2012-08-29 ENCOUNTER — Emergency Department (HOSPITAL_COMMUNITY): Payer: Self-pay

## 2012-08-29 ENCOUNTER — Emergency Department (HOSPITAL_COMMUNITY)
Admission: EM | Admit: 2012-08-29 | Discharge: 2012-08-29 | Disposition: A | Discharge status: Home / Self Care | Payer: Self-pay | Attending: Emergency Medicine | Admitting: Emergency Medicine

## 2012-08-29 ENCOUNTER — Encounter (HOSPITAL_COMMUNITY): Payer: Self-pay | Admitting: *Deleted

## 2012-08-29 DIAGNOSIS — R109 Unspecified abdominal pain: Secondary | ICD-10-CM | POA: Insufficient documentation

## 2012-08-29 DIAGNOSIS — R0789 Other chest pain: Secondary | ICD-10-CM | POA: Insufficient documentation

## 2012-08-29 DIAGNOSIS — Z3202 Encounter for pregnancy test, result negative: Secondary | ICD-10-CM | POA: Insufficient documentation

## 2012-08-29 DIAGNOSIS — Y939 Activity, unspecified: Secondary | ICD-10-CM | POA: Insufficient documentation

## 2012-08-29 DIAGNOSIS — Z8679 Personal history of other diseases of the circulatory system: Secondary | ICD-10-CM | POA: Insufficient documentation

## 2012-08-29 DIAGNOSIS — X58XXXA Exposure to other specified factors, initial encounter: Secondary | ICD-10-CM | POA: Insufficient documentation

## 2012-08-29 DIAGNOSIS — M549 Dorsalgia, unspecified: Secondary | ICD-10-CM | POA: Insufficient documentation

## 2012-08-29 DIAGNOSIS — Y929 Unspecified place or not applicable: Secondary | ICD-10-CM | POA: Insufficient documentation

## 2012-08-29 DIAGNOSIS — T148XXA Other injury of unspecified body region, initial encounter: Secondary | ICD-10-CM | POA: Insufficient documentation

## 2012-08-29 DIAGNOSIS — R110 Nausea: Secondary | ICD-10-CM | POA: Insufficient documentation

## 2012-08-29 DIAGNOSIS — K2970 Gastritis, unspecified, without bleeding: Secondary | ICD-10-CM | POA: Insufficient documentation

## 2012-08-29 LAB — COMPREHENSIVE METABOLIC PANEL
ALT: 8 U/L (ref 0–35)
AST: 18 U/L (ref 0–37)
CO2: 22 mEq/L (ref 19–32)
Calcium: 9.1 mg/dL (ref 8.4–10.5)
Chloride: 107 mEq/L (ref 96–112)
GFR calc non Af Amer: 71 mL/min — ABNORMAL LOW (ref >90)
Sodium: 139 mEq/L (ref 135–145)

## 2012-08-29 LAB — URINALYSIS, ROUTINE W REFLEX MICROSCOPIC
Bilirubin Urine: NEGATIVE
Hgb urine dipstick: NEGATIVE
Ketones, ur: NEGATIVE mg/dL
Protein, ur: NEGATIVE mg/dL
Urobilinogen, UA: 1 mg/dL (ref 0.0–1.0)

## 2012-08-29 LAB — CBC WITH DIFFERENTIAL/PLATELET
Eosinophils Relative: 0 % (ref 0–5)
Monocytes Relative: 12 % (ref 3–12)
Neutrophils Relative %: 52 % (ref 43–77)
Platelets: 216 10*3/uL (ref 150–400)
RBC: 4.35 MIL/uL (ref 3.87–5.11)
WBC: 5.4 10*3/uL (ref 4.0–10.5)

## 2012-08-29 MED ORDER — LANSOPRAZOLE 30 MG PO CPDR: 30.0000 mg | DELAYED_RELEASE_CAPSULE | Freq: Every day | ORAL | Status: DC

## 2012-08-29 MED ORDER — METHOCARBAMOL 500 MG PO TABS: 500.0000 mg | ORAL_TABLET | Freq: Two times a day (BID) | ORAL | Status: DC

## 2012-08-29 MED ORDER — ONDANSETRON 4 MG PO TBDP: ORAL_TABLET | ORAL | Status: DC

## 2012-08-29 MED ORDER — TRAMADOL HCL 50 MG PO TABS: 50.0000 mg | ORAL_TABLET | Freq: Four times a day (QID) | ORAL | Status: DC | PRN

## 2012-08-29 MED ORDER — TRAMADOL HCL 50 MG PO TABS
50.0000 mg | ORAL_TABLET | Freq: Once | ORAL | Status: AC
  Administered 2012-08-29: 50 mg via ORAL
  Filled 2012-08-29: qty 1

## 2012-08-29 MED ORDER — ONDANSETRON 4 MG PO TBDP
4.0000 mg | ORAL_TABLET | Freq: Once | ORAL | Status: AC
  Administered 2012-08-29: 4 mg via ORAL
  Filled 2012-08-29: qty 1

## 2012-08-29 MED ORDER — GI COCKTAIL ~~LOC~~
30.0000 mL | Freq: Once | ORAL | Status: AC
  Administered 2012-08-29: 30 mL via ORAL
  Filled 2012-08-29: qty 30

## 2012-08-29 MED ORDER — PANTOPRAZOLE SODIUM 40 MG PO TBEC
40.0000 mg | DELAYED_RELEASE_TABLET | Freq: Once | ORAL | Status: AC
  Administered 2012-08-29: 40 mg via ORAL
  Filled 2012-08-29: qty 1

## 2012-08-29 NOTE — ED Notes (Signed)
Explained to pt that would give pain med after Zofran takes effect. Husband at bedside

## 2012-08-29 NOTE — ED Notes (Signed)
C/o left sided chest pain started this am, right flank pain radiating to right abd area-- tender over RUQ to right side area. Chest -- tender to palpaption over left chest/breast area.

## 2012-08-29 NOTE — ED Provider Notes (Signed)
CSN: [id number]     Arrival date & time 08/29/12  0718 History     First MD Initiated Contact with Patient 08/29/12 [phone]     Chief Complaint  Patient presents with  . Chest Pain   (Consider location/radiation/quality/duration/timing/severity/associated sxs/prior Treatment) HPI Pt with R lateral chest pain that radiates down R lumbar paraspinal muscles starting 4-5 days ago. Pain is constant and worse with movement. States she had nausea on Saturday though now improved. She woke this AM at 0430 and began having sharp sternal chest pain. No radiation. No N/V, diaphoresis. No SOB or cough. No fever or chills. No recent travel or surgery. Pt states she has also had several days of "discolored" urine. No other urinary complaints.  Past Medical History  Diagnosis Date  . Ulcer   . Bradycardia    Past Surgical History  Procedure Laterality Date  . Ulcer surgery    . Ankle surgery     No family history on file. History  Substance Use Topics  . Smoking status: Never Smoker   . Smokeless tobacco: Not on file  . Alcohol Use: Yes   OB History   Grav Para Term Preterm Abortions TAB SAB Ect Mult Living                 Review of Systems  Constitutional: Negative for fever and chills.  HENT: Negative for neck pain and neck stiffness.   Respiratory: Negative for cough and shortness of breath.   Cardiovascular: Positive for chest pain. Negative for palpitations and leg swelling.  Gastrointestinal: Positive for nausea and abdominal pain. Negative for vomiting, diarrhea, constipation and blood in stool.  Genitourinary: Positive for flank pain. Negative for dysuria, frequency, hematuria, difficulty urinating and genital sores.  Musculoskeletal: Positive for myalgias and back pain.  Skin: Negative for pallor, rash and wound.  Neurological: Negative for dizziness, weakness, light-headedness, numbness and headaches.  All other systems reviewed and are negative.    Allergies  Review of  patient's allergies indicates no known allergies.  Home Medications   Current Outpatient Rx  Name  Route  Sig  Dispense  Refill  . ibuprofen (ADVIL,MOTRIN) 200 MG tablet   Oral   Take 400 mg by mouth every 6 (six) hours as needed for pain.         Marland Kitchen lansoprazole (PREVACID) 30 MG capsule   Oral   Take 1 capsule (30 mg total) by mouth daily.   30 capsule   0   . methocarbamol (ROBAXIN) 500 MG tablet   Oral   Take 1 tablet (500 mg total) by mouth 2 (two) times daily.   20 tablet   0   . ondansetron (ZOFRAN ODT) 4 MG disintegrating tablet      4mg  ODT q4 hours prn nausea/vomit   8 tablet   0   . traMADol (ULTRAM) 50 MG tablet   Oral   Take 1 tablet (50 mg total) by mouth every 6 (six) hours as needed for pain.   15 tablet   0    BP 106/70  Pulse 72  Temp(Src) 97.8 F (36.6 C) (Oral)  Resp 16  SpO2 100%  LMP 08/08/2012 Physical Exam  Nursing note and vitals reviewed. Constitutional: She is oriented to person, place, and time. She appears well-developed and well-nourished. No distress.  Appears comfortable sitting in bed  HENT:  Head: Normocephalic and atraumatic.  Mouth/Throat: Oropharynx is clear and moist.  Eyes: EOM are normal. Pupils are equal, round, and  reactive to light.  Neck: Normal range of motion. Neck supple.  Cardiovascular: Normal rate and regular rhythm.   Pulmonary/Chest: Effort normal and breath sounds normal. No respiratory distress. She has no wheezes. She has no rales. She exhibits tenderness (Chest tenderness completely reproduced with palpation of sternum).  Abdominal: Soft. Bowel sounds are normal. She exhibits no distension and no mass. There is tenderness (TTP epigastrum and RUQ). There is no rebound and no guarding.  Musculoskeletal: Normal range of motion. She exhibits tenderness (TTP R Lateral thoracic, R thoracic paraspinal muscles and R lumbar paraspinal muscles. ). She exhibits no edema.  Negative straight leg raise. No def CVAT  though difficult to assess with diffuse back tenderness  Neurological: She is alert and oriented to person, place, and time.  5/5 motor in all ext, sensation intact  Skin: Skin is warm and dry. No rash noted. No erythema.  Psychiatric: She has a normal mood and affect. Her behavior is normal.    ED Course   Procedures (including critical care time)  Labs Reviewed  CBC WITH DIFFERENTIAL - Abnormal; Notable for the following:    Hemoglobin 9.1 (*)    HCT 29.9 (*)    MCV 68.7 (*)    MCH 20.9 (*)    RDW 20.0 (*)    All other components within normal limits  COMPREHENSIVE METABOLIC PANEL - Abnormal; Notable for the following:    Glucose, Bld 104 (*)    GFR calc non Af Amer 71 (*)    GFR calc Af Amer 83 (*)    All other components within normal limits  LIPASE, BLOOD  TROPONIN I  URINALYSIS, ROUTINE W REFLEX MICROSCOPIC  PREGNANCY, URINE   US Abdomen Complete  08/29/2012   *RADIOLOGY REPORT*  Clinical Data:  Right upper quadrant pain.  COMPLETE ABDOMINAL ULTRASOUND  Comparison:  None.  Findings:  Gallbladder:  No gallstones, gallbladder wall thickening, or pericholecystic fluid.  Common bile duct:  Measures 0.7 cm.  Liver:  No focal lesion identified.  Within normal limits in parenchymal echogenicity.  IVC:  Appears normal.  Pancreas:  No focal abnormality seen.  Spleen:  Measures 7.3 cm and appears normal.  Right Kidney:  Measures 10.0 cm and appears normal.  Left Kidney:  Measures 11.4 cm and appears normal.  Abdominal aorta:  No aneurysm identified.  IMPRESSION: Negative abdominal ultrasound.   Original Report Authenticated By: Holley Dexter, M.D.   Dg Abd Acute W/chest  08/29/2012   *RADIOLOGY REPORT*  Clinical Data: Chest pain  ACUTE ABDOMEN SERIES (ABDOMEN 2 VIEW & CHEST 1 VIEW)  Comparison: 11/26/2010  Findings: Heart size is normal.  No pleural effusion or edema identified.  No airspace consolidation noted.  Surgical clips at the GE junction are identified.  The bowel gas pattern  appears nonobstructed.  No dilated loop of small bowel or air-fluid level identified.  IMPRESSION:  1.  No acute cardiopulmonary abnormalities. 2.  Nonobstructive bowel gas pattern.   Original Report Authenticated By: Signa Kell, M.D.   1. Atypical chest pain   2. Gastritis   3. Muscle strain     Date: 08/29/2012  Rate: 72  Rhythm: normal sinus rhythm  QRS Axis: normal  Intervals: normal  ST/T Wave abnormalities: normal  Conduction Disutrbances:none  Narrative Interpretation:   Old EKG Reviewed: changes noted No longer in bigeminy  MDM  Very low suspicion for CAD. Pain is reproduced with palpation. Suspect multiple causes for pt's symptoms. Return precautions given.    Loren Racer,  MD 08/29/12 1247

## 2012-08-29 NOTE — ED Notes (Signed)
Pt is here with chest pain that started at 0430 today.  Complains of right lateral side pain that radiates down back and patient is complaining of discolored urine

## 2012-08-29 NOTE — ED Notes (Signed)
Pt states she vomited pills that were given-- requesting something for pain.

## 2012-08-31 NOTE — Progress Notes (Signed)
   CARE MANAGEMENT ED NOTE 08/31/2012  Patient:  Sara Friedman, Sara Friedman   Account Number:  [id number]  Date Initiated:  08/31/2012  Documentation initiated by:  Fransico Michael  Subjective/Objective Assessment:   presented to ED with c/o chest pain on 08/29/12     Subjective/Objective Assessment Detail:     Action/Plan:   med assistance   Action/Plan Detail:   Anticipated DC Date:  08/29/2012     Status Recommendation to Physician:   Result of Recommendation:      DC Planning Services  CM consult  MATCH Program    Choice offered to / List presented to:            Status of service:  Completed, signed off  ED Comments:   ED Comments Detail:  09/01/12-0942- J.Vinny Taranto,RN,BSN [phone]      Spoke with Nicolasa Ducking, AD for CM regarding medications. Ultram is not elligibile for MATCH. Assessed patient elligiblility for MATCH. Enrolled in program. Phoned patient back. Reinforced $3 copay for zofran and robaxin and that [facility] program was only accessible once a year. Voiced understanding.  Informed patient that prevacid was OTC and that we could not help with Ultram. MATCH letter to be left at nurse first desk for patient to pick up.  08/31/12-0936-J.Jahree Dermody,RN,BSN [phone]      Received call from patient asking for assistance with prescriptions that she was discharged on Monday. Patient discharged on Prevacid, ultram,zofran, and Robaxin. Explained MATCH program to patient. Voiced interest in enrollment.

## 2012-09-07 ENCOUNTER — Ambulatory Visit: Payer: Self-pay

## 2012-09-15 ENCOUNTER — Ambulatory Visit: Payer: Self-pay | Admitting: Internal Medicine

## 2012-09-21 ENCOUNTER — Ambulatory Visit: Payer: Self-pay

## 2012-09-26 ENCOUNTER — Ambulatory Visit: Payer: Self-pay

## 2012-09-28 ENCOUNTER — Ambulatory Visit: Payer: Self-pay

## 2013-10-23 ENCOUNTER — Emergency Department (HOSPITAL_COMMUNITY)
Admission: EM | Admit: 2013-10-23 | Discharge: 2013-10-23 | Disposition: A | Discharge status: Home / Self Care | Payer: Self-pay | Attending: Emergency Medicine | Admitting: Emergency Medicine

## 2013-10-23 ENCOUNTER — Encounter (HOSPITAL_COMMUNITY): Payer: Self-pay | Admitting: Emergency Medicine

## 2013-10-23 ENCOUNTER — Emergency Department (HOSPITAL_COMMUNITY): Payer: Self-pay

## 2013-10-23 DIAGNOSIS — R5383 Other fatigue: Secondary | ICD-10-CM | POA: Insufficient documentation

## 2013-10-23 DIAGNOSIS — D649 Anemia, unspecified: Secondary | ICD-10-CM

## 2013-10-23 DIAGNOSIS — R0789 Other chest pain: Secondary | ICD-10-CM

## 2013-10-23 DIAGNOSIS — Z90710 Acquired absence of both cervix and uterus: Secondary | ICD-10-CM | POA: Insufficient documentation

## 2013-10-23 DIAGNOSIS — R42 Dizziness and giddiness: Secondary | ICD-10-CM | POA: Insufficient documentation

## 2013-10-23 DIAGNOSIS — Z872 Personal history of diseases of the skin and subcutaneous tissue: Secondary | ICD-10-CM | POA: Insufficient documentation

## 2013-10-23 LAB — CBC
HCT: 35.2 % — ABNORMAL LOW (ref 36.0–46.0)
Hemoglobin: 10.4 g/dL — ABNORMAL LOW (ref 12.0–15.0)
MCH: 20.2 pg — ABNORMAL LOW (ref 26.0–34.0)
MCHC: 29.5 g/dL — ABNORMAL LOW (ref 30.0–36.0)
MCV: 68.3 fL — ABNORMAL LOW (ref 78.0–100.0)
PLATELETS: 252 10*3/uL (ref 150–400)
RBC: 5.15 MIL/uL — AB (ref 3.87–5.11)
RDW: 21.6 % — ABNORMAL HIGH (ref 11.5–15.5)
WBC: 5.2 10*3/uL (ref 4.0–10.5)

## 2013-10-23 LAB — BASIC METABOLIC PANEL
ANION GAP: 14 (ref 5–15)
BUN: 12 mg/dL (ref 6–23)
CALCIUM: 9.8 mg/dL (ref 8.4–10.5)
CHLORIDE: 101 meq/L (ref 96–112)
CO2: 22 meq/L (ref 19–32)
Creatinine, Ser: 1.05 mg/dL (ref 0.50–1.10)
GFR calc Af Amer: 73 mL/min — ABNORMAL LOW (ref >90)
GFR calc non Af Amer: 63 mL/min — ABNORMAL LOW (ref >90)
Glucose, Bld: 93 mg/dL (ref 70–99)
POTASSIUM: 4 meq/L (ref 3.7–5.3)
SODIUM: 137 meq/L (ref 137–147)

## 2013-10-23 LAB — I-STAT TROPONIN, ED: TROPONIN I, POC: 0 ng/mL (ref 0.00–0.08)

## 2013-10-23 LAB — LIPASE, BLOOD: LIPASE: 31 U/L (ref 11–59)

## 2013-10-23 LAB — PRO B NATRIURETIC PEPTIDE: PRO B NATRI PEPTIDE: 24.9 pg/mL (ref 0–125)

## 2013-10-23 MED ORDER — KETOROLAC TROMETHAMINE 15 MG/ML IJ SOLN
15.0000 mg | Freq: Once | INTRAMUSCULAR | Status: AC
  Administered 2013-10-23: 15 mg via INTRAVENOUS
  Filled 2013-10-23: qty 1

## 2013-10-23 MED ORDER — MORPHINE SULFATE 4 MG/ML IJ SOLN
6.0000 mg | Freq: Once | INTRAMUSCULAR | Status: AC
  Administered 2013-10-23: 6 mg via INTRAVENOUS
  Filled 2013-10-23: qty 2

## 2013-10-23 NOTE — Discharge Instructions (Signed)
If you were given medicines take as directed.  If you are on coumadin or contraceptives realize their levels and effectiveness is altered by many different medicines.  If you have any reaction (rash, tongues swelling, other) to the medicines stop taking and see a physician.   Please follow up as directed and return to the ER or see a physician for new or worsening symptoms.  Thank you. Filed Vitals:   10/23/13 0819  BP: 123/73  Pulse: 74  Temp: 98.5 F (36.9 C)  TempSrc: Oral  Resp: 20  Height: 5\' 6"  (1.676 m)  Weight: 198 lb (89.812 kg)    Chest Pain (Nonspecific) It is often hard to give a specific diagnosis for the cause of chest pain. There is always a chance that your pain could be related to something serious, such as a heart attack or a blood clot in the lungs. You need to follow up with your health care provider for further evaluation. CAUSES   Heartburn.  Pneumonia or bronchitis.  Anxiety or stress.  Inflammation around your heart (pericarditis) or lung (pleuritis or pleurisy).  A blood clot in the lung.  A collapsed lung (pneumothorax). It can develop suddenly on its own (spontaneous pneumothorax) or from trauma to the chest.  Shingles infection (herpes zoster virus). The chest wall is composed of bones, muscles, and cartilage. Any of these can be the source of the pain.  The bones can be bruised by injury.  The muscles or cartilage can be strained by coughing or overwork.  The cartilage can be affected by inflammation and become sore (costochondritis). DIAGNOSIS  Lab tests or other studies may be needed to find the cause of your pain. Your health care provider may have you take a test called an ambulatory electrocardiogram (ECG). An ECG records your heartbeat patterns over a 24-hour period. You may also have other tests, such as:  Transthoracic echocardiogram (TTE). During echocardiography, sound waves are used to evaluate how blood flows through your  heart.  Transesophageal echocardiogram (TEE).  Cardiac monitoring. This allows your health care provider to monitor your heart rate and rhythm in real time.  Holter monitor. This is a portable device that records your heartbeat and can help diagnose heart arrhythmias. It allows your health care provider to track your heart activity for several days, if needed.  Stress tests by exercise or by giving medicine that makes the heart beat faster. TREATMENT   Treatment depends on what may be causing your chest pain. Treatment may include:  Acid blockers for heartburn.  Anti-inflammatory medicine.  Pain medicine for inflammatory conditions.  Antibiotics if an infection is present.  You may be advised to change lifestyle habits. This includes stopping smoking and avoiding alcohol, caffeine, and chocolate.  You may be advised to keep your head raised (elevated) when sleeping. This reduces the chance of acid going backward from your stomach into your esophagus. Most of the time, nonspecific chest pain will improve within 2-3 days with rest and mild pain medicine.  HOME CARE INSTRUCTIONS   If antibiotics were prescribed, take them as directed. Finish them even if you start to feel better.  For the next few days, avoid physical activities that bring on chest pain. Continue physical activities as directed.  Do not use any tobacco products, including cigarettes, chewing tobacco, or electronic cigarettes.  Avoid drinking alcohol.  Only take medicine as directed by your health care provider.  Follow your health care provider's suggestions for further testing if your chest  pain does not go away.  Keep any follow-up appointments you made. If you do not go to an appointment, you could develop lasting (chronic) problems with pain. If there is any problem keeping an appointment, call to reschedule. SEEK MEDICAL CARE IF:   Your chest pain does not go away, even after treatment.  You have a rash  with blisters on your chest.  You have a fever. SEEK IMMEDIATE MEDICAL CARE IF:   You have increased chest pain or pain that spreads to your arm, neck, jaw, back, or abdomen.  You have shortness of breath.  You have an increasing cough, or you cough up blood.  You have severe back or abdominal pain.  You feel nauseous or vomit.  You have severe weakness.  You faint.  You have chills. This is an emergency. Do not wait to see if the pain will go away. Get medical help at once. Call your local emergency services (911 in [place] Do not drive yourself to the hospital. MAKE SURE YOU:   Understand these instructions.  Will watch your condition.  Will get help right away if you are not doing well or get worse. Document Released: 10/08/2004 Document Revised: 01/03/2013 Document Reviewed: 08/04/2007 [facility] Patient Information 2015 [place], [place]. This information is not intended to replace advice given to you by your health care provider. Make sure you discuss any questions you have with your health care provider.

## 2013-10-23 NOTE — ED Notes (Signed)
Pt c/o center chest pain onset yesterday with shortness of breath.

## 2013-10-23 NOTE — ED Provider Notes (Signed)
CSN: [id number]     Arrival date & [id number] 10/23/13  [id number] History   First MD Initiated Contact with Patient 10/23/13 [phone]     Chief Complaint  Patient presents with  . Chest Pain     (Consider location/radiation/quality/duration/timing/severity/associated sxs/prior Treatment) HPI Comments: 48 year old female with history of gastric ulcer and partial gastrectomy secondarily, nonsmoker, anemia presents with chest ache constant since yesterday afternoon. Patient's had this in the past or this is lasting longer. No heart attack, blood clot, heart failure history. No recent surgeries, long travel, unilateral leg swelling or leg pain, blood clot history, active cancer or hemoptysis. Symptoms have been constant and nothing specifically worsens or improves. No exertional or diaphoretic component. It is anterior nonradiating.  Patient was recently seen 2 weeks ago and told she had anemia with a occult  positive stools, patient has not noticed any gross blood since.  Patient is a 48 y.o. female presenting with chest pain. The history is provided by the patient.  Chest Pain Associated symptoms: fatigue and shortness of breath (mild)   Associated symptoms: no abdominal pain, no back pain, no fever, no headache and not vomiting     Past Medical History  Diagnosis Date  . Ulcer   . Bradycardia    Past Surgical History  Procedure Laterality Date  . Ulcer surgery    . Ankle surgery     No family history on file. History  Substance Use Topics  . Smoking status: Never Smoker   . Smokeless tobacco: Not on file  . Alcohol Use: No   OB History   Grav Para Term Preterm Abortions TAB SAB Ect Mult Living                 Review of Systems  Constitutional: Positive for fatigue. Negative for fever and chills.  HENT: Negative for congestion.   Eyes: Negative for visual disturbance.  Respiratory: Positive for shortness of breath (mild).   Cardiovascular: Positive for chest pain. Negative for leg  swelling.  Gastrointestinal: Negative for vomiting and abdominal pain.  Genitourinary: Negative for dysuria and flank pain.  Musculoskeletal: Negative for back pain, neck pain and neck stiffness.  Skin: Negative for rash.  Neurological: Positive for light-headedness. Negative for headaches.      Allergies  Review of patient's allergies indicates no known allergies.  Home Medications   Prior to Admission medications   Not on File   BP 123/73  Pulse 74  Temp(Src) 98.5 F (36.9 C) (Oral)  Resp 20  Ht 5\' 6"  (1.676 m)  Wt 198 lb (89.812 kg)  BMI 31.97 kg/m2  LMP 10/09/2013 Physical Exam  Nursing note and vitals reviewed. Constitutional: She is oriented to person, place, and time. She appears well-developed and well-nourished.  HENT:  Head: Normocephalic and atraumatic.  Eyes: Conjunctivae are normal. Right eye exhibits no discharge. Left eye exhibits no discharge.  Neck: Normal range of motion. Neck supple. No tracheal deviation present.  Cardiovascular: Normal rate and regular rhythm.   Pulmonary/Chest: Effort normal and breath sounds normal.  Abdominal: Soft. She exhibits no distension. There is no tenderness. There is no guarding.  Musculoskeletal: She exhibits no edema.  Neurological: She is alert and oriented to person, place, and time.  Skin: Skin is warm. No rash noted.  Psychiatric: She has a normal mood and affect.    ED Course  Procedures (including critical care time) Labs Review Labs Reviewed  CBC - Abnormal; Notable for the following:    RBC 5.15 (*)  Hemoglobin 10.4 (*)    HCT 35.2 (*)    MCV 68.3 (*)    MCH 20.2 (*)    MCHC 29.5 (*)    RDW 21.6 (*)    All other components within normal limits  BASIC METABOLIC PANEL - Abnormal; Notable for the following:    GFR calc non Af Amer 63 (*)    GFR calc Af Amer 73 (*)    All other components within normal limits  PRO B NATRIURETIC PEPTIDE  LIPASE, BLOOD  I-STAT TROPOININ, ED  I-STAT TROPOININ, ED     Imaging Review Dg Chest 2 View (if Patient Has Fever And/or Copd)  10/23/2013   CLINICAL DATA:  CHEST PAIN x2 days, shortness of breath, history of anemia  EXAM: CHEST - 2 VIEW  COMPARISON:  08/29/2012  FINDINGS: Lungs are clear. Heart size and mediastinal contours are within normal limits. No effusion. Surgical clips near the GE junction as before. No pneumothorax. Visualized skeletal structures are unremarkable.  IMPRESSION: No acute cardiopulmonary disease.   Electronically Signed   By: Oley Balmaniel  Hassell M.D.   On: 10/23/2013 08:52     EKG Interpretation   Date/Time:  Monday October 23 2013 08:19:50 EDT Ventricular Rate:  71 PR Interval:  168 QRS Duration: 88 QT Interval:  404 QTC Calculation: 439 R Axis:   65 Text Interpretation:  Normal sinus rhythm Normal ECG similar to previous  Confirmed by Ishia Tenorio  MD, Tyrhonda Georgiades ([id number]) on 10/23/2013 9:11:49 AM      MDM   Final diagnoses:  Atypical chest pain  Anemia, unspecified anemia type   Well-appearing female, low-risk suspicion/history for cardiac and very low-risk blood clot. EKG reviewed no acute findings. Chest pain atypical constant since yesterday, plan for delta troponin and outpatient followup to discuss possible stress test if symptoms worsen or do not improve.  Patient has no classic risk for blood clots, no current shortness of breath.   CP free on recheck. Patient feels improved after observation ER. Cardiac screen unremarkable. Patient comfortable the outpatient followup with [facility] physicians and reasons to return discussed.  Results and differential diagnosis were discussed with the patient/parent/guardian. Close follow up outpatient was discussed, comfortable with the plan.   Medications  morphine 4 MG/ML injection 6 mg (6 mg Intravenous Given 10/23/13 1016)  ketorolac (TORADOL) 15 MG/ML injection 15 mg (15 mg Intravenous Given 10/23/13 1109)    Filed Vitals:   10/23/13 0819  BP: 123/73  Pulse: 74  Temp:  98.5 F (36.9 C)  TempSrc: Oral  Resp: 20  Height: 5\' 6"  (1.676 m)  Weight: 198 lb (89.812 kg)    Final diagnoses:  Atypical chest pain  Anemia, unspecified anemia type        Enid SkeensJoshua M Danyael Alipio, MD 10/23/13 1256

## 2013-10-23 NOTE — Discharge Planning (Signed)
[id number] Eligibility Specialist  [place] and my contact information provided for any future questions or concerns.

## 2014-06-26 ENCOUNTER — Encounter (HOSPITAL_COMMUNITY): Payer: Self-pay

## 2014-06-26 ENCOUNTER — Emergency Department (HOSPITAL_COMMUNITY): Payer: Self-pay

## 2014-06-26 ENCOUNTER — Emergency Department (HOSPITAL_COMMUNITY)
Admission: EM | Admit: 2014-06-26 | Discharge: 2014-06-26 | Disposition: A | Discharge status: Home / Self Care | Payer: Self-pay | Attending: Emergency Medicine | Admitting: Emergency Medicine

## 2014-06-26 DIAGNOSIS — R600 Localized edema: Secondary | ICD-10-CM | POA: Insufficient documentation

## 2014-06-26 DIAGNOSIS — Z872 Personal history of diseases of the skin and subcutaneous tissue: Secondary | ICD-10-CM | POA: Insufficient documentation

## 2014-06-26 DIAGNOSIS — R0789 Other chest pain: Secondary | ICD-10-CM | POA: Insufficient documentation

## 2014-06-26 DIAGNOSIS — R1011 Right upper quadrant pain: Secondary | ICD-10-CM | POA: Insufficient documentation

## 2014-06-26 LAB — COMPREHENSIVE METABOLIC PANEL
ALT: 10 U/L — ABNORMAL LOW (ref 14–54)
AST: 18 U/L (ref 15–41)
Albumin: 3.4 g/dL — ABNORMAL LOW (ref 3.5–5.0)
Alkaline Phosphatase: 65 U/L (ref 38–126)
Anion gap: 9 (ref 5–15)
BUN: 7 mg/dL (ref 6–20)
CO2: 23 mmol/L (ref 22–32)
Calcium: 8.8 mg/dL — ABNORMAL LOW (ref 8.9–10.3)
Chloride: 108 mmol/L (ref 101–111)
Creatinine, Ser: 1 mg/dL (ref 0.44–1.00)
GFR calc Af Amer: >60 mL/min (ref >60)
GFR calc non Af Amer: >60 mL/min (ref >60)
Glucose, Bld: 91 mg/dL (ref 65–99)
Potassium: 3.6 mmol/L (ref 3.5–5.1)
Sodium: 140 mmol/L (ref 135–145)
Total Bilirubin: 0.6 mg/dL (ref 0.3–1.2)
Total Protein: 6.6 g/dL (ref 6.5–8.1)

## 2014-06-26 LAB — URINALYSIS, ROUTINE W REFLEX MICROSCOPIC
Bilirubin Urine: NEGATIVE
Glucose, UA: NEGATIVE mg/dL
Hgb urine dipstick: NEGATIVE
Ketones, ur: NEGATIVE mg/dL
Nitrite: NEGATIVE
Protein, ur: NEGATIVE mg/dL
Specific Gravity, Urine: 1.012 (ref 1.005–1.030)
Urobilinogen, UA: 1 mg/dL (ref 0.0–1.0)
pH: 6.5 (ref 5.0–8.0)

## 2014-06-26 LAB — CBC WITH DIFFERENTIAL/PLATELET
Basophils Absolute: 0 10*3/uL (ref 0.0–0.1)
Basophils Relative: 1 % (ref 0–1)
Eosinophils Absolute: 0 10*3/uL (ref 0.0–0.7)
Eosinophils Relative: 1 % (ref 0–5)
HCT: 35 % — ABNORMAL LOW (ref 36.0–46.0)
Hemoglobin: 11 g/dL — ABNORMAL LOW (ref 12.0–15.0)
Lymphocytes Relative: 39 % (ref 12–46)
Lymphs Abs: 2.2 10*3/uL (ref 0.7–4.0)
MCH: 23.8 pg — ABNORMAL LOW (ref 26.0–34.0)
MCHC: 31.4 g/dL (ref 30.0–36.0)
MCV: 75.8 fL — ABNORMAL LOW (ref 78.0–100.0)
Monocytes Absolute: 0.5 10*3/uL (ref 0.1–1.0)
Monocytes Relative: 9 % (ref 3–12)
Neutro Abs: 2.9 10*3/uL (ref 1.7–7.7)
Neutrophils Relative %: 50 % (ref 43–77)
Platelets: 233 10*3/uL (ref 150–400)
RBC: 4.62 MIL/uL (ref 3.87–5.11)
RDW: 18.3 % — ABNORMAL HIGH (ref 11.5–15.5)
WBC: 5.7 10*3/uL (ref 4.0–10.5)

## 2014-06-26 LAB — I-STAT TROPONIN, ED
Troponin i, poc: 0 ng/mL (ref 0.00–0.08)
Troponin i, poc: 0 ng/mL (ref 0.00–0.08)

## 2014-06-26 LAB — URINE MICROSCOPIC-ADD ON

## 2014-06-26 LAB — LIPASE, BLOOD: LIPASE: 25 U/L (ref 22–51)

## 2014-06-26 MED ORDER — ONDANSETRON HCL 4 MG PO TABS: 4.0000 mg | ORAL_TABLET | Freq: Four times a day (QID) | ORAL | Status: AC

## 2014-06-26 NOTE — ED Notes (Signed)
Pt undressed, in gown, on monitor, continuous pulse oximetry and blood pressure cuff; Redmond Baseman, RN and Bernette Redbird, EMT present in room

## 2014-06-26 NOTE — Discharge Instructions (Signed)
Abdominal Pain Many things can cause abdominal pain. Usually, abdominal pain is not caused by a disease and will improve without treatment. It can often be observed and treated at home. Your health care provider will do a physical exam and possibly order blood tests and X-rays to help determine the seriousness of your pain. However, in many cases, more time must pass before a clear cause of the pain can be found. Before that point, your health care provider may not know if you need more testing or further treatment. HOME CARE INSTRUCTIONS  Monitor your abdominal pain for any changes. The following actions may help to alleviate any discomfort you are experiencing:  Only take over-the-counter or prescription medicines as directed by your health care provider.  Do not take laxatives unless directed to do so by your health care provider.  Try a clear liquid diet (broth, tea, or water) as directed by your health care provider. Slowly move to a bland diet as tolerated. SEEK MEDICAL CARE IF:  You have unexplained abdominal pain.  You have abdominal pain associated with nausea or diarrhea.  You have pain when you urinate or have a bowel movement.  You experience abdominal pain that wakes you in the night.  You have abdominal pain that is worsened or improved by eating food.  You have abdominal pain that is worsened with eating fatty foods.  You have a fever. SEEK IMMEDIATE MEDICAL CARE IF:   Your pain does not go away within 2 hours.  You keep throwing up (vomiting).  Your pain is felt only in portions of the abdomen, such as the right side or the left lower portion of the abdomen.  You pass bloody or black tarry stools. MAKE SURE YOU:  Understand these instructions.   Will watch your condition.   Will get help right away if you are not doing well or get worse.  Document Released: 10/08/2004 Document Revised: 01/03/2013 Document Reviewed: 09/07/2012 [facility] Patient Information  2015 [place], [place]. This information is not intended to replace advice given to you by your health care provider. Make sure you discuss any questions you have with your health care provider.  Please monitor for new or worsening signs or symptoms, return immediately if any present. Please follow up with [facility] wellness for further evaluation and management. Please call them immediately after beginning the emergency room and schedule follow-up in next 2-3 days.

## 2014-06-26 NOTE — ED Notes (Signed)
Pt. Complaint of CP  Below R breast starting this AM at 0600. Pt. States R hand is tingling too. Denies N/V/SOB. Pt. In NAD. AxO x4.

## 2014-06-26 NOTE — ED Provider Notes (Signed)
CSN: [id number]     Arrival date & time 06/26/14  0919 History   First MD Initiated Contact with Patient 06/26/14 [phone]     Chief Complaint  Patient presents with  . Chest Pain    HPI   49 year old female presents today with right upper quadrant and right chest pain below her breast. She reports the pain is persistent through the night starting around 3am, worsened to 6 AM this morning. She notes a significant past medical history of gastrectomy, denies indigestion, regurgitation, fever, night sweats; not currently taking any medications for this. She denies alcohol use, does not smoke, no hyperlipidemia, no swelling of her extremities, no prolonged immobilization, estrogen use, recent trauma, any ACS risk factors or DVT PE risk factors. Patient denies ever primary care provider, has family history of diabetes has not had her blood sugar checked recently, reports having increased urination at times and requests [place] to check her blood sugar. She has not tried anything at this time for her pain. Patient denies fever, chills, cough, nausea vomiting, shortness of breath, changes in bowel movements ( last yesterday).     Past Medical History  Diagnosis Date  . Ulcer   . Bradycardia    Past Surgical History  Procedure Laterality Date  . Ulcer surgery    . Ankle surgery     No family history on file. History  Substance Use Topics  . Smoking status: Never Smoker   . Smokeless tobacco: Not on file  . Alcohol Use: No   OB History    No data available     Review of Systems  All other systems reviewed and are negative.   Allergies  Review of patient's allergies indicates no known allergies.  Home Medications   Prior to Admission medications   Medication Sig Start Date End Date Taking? Authorizing Provider  acetaminophen (TYLENOL) 325 MG tablet Take 650 mg by mouth every 6 (six) hours as needed for headache.   Yes Historical Provider, MD  ondansetron (ZOFRAN) 4 MG tablet Take 1 tablet  (4 mg total) by mouth every 6 (six) hours. 06/26/14   Eyvonne Mechanic, PA-C   BP 116/82 mmHg  Pulse 61  Temp(Src) 98.4 F (36.9 C) (Oral)  Resp 12  Ht 5\' 6"  (1.676 m)  Wt 215 lb (97.523 kg)  BMI 34.72 kg/m2  SpO2 100%  LMP 06/05/2014 Physical Exam  Constitutional: She is oriented to person, place, and time. She appears well-developed and well-nourished.  HENT:  Head: Normocephalic and atraumatic.  Eyes: Conjunctivae are normal. Pupils are equal, round, and reactive to light. Right eye exhibits no discharge. Left eye exhibits no discharge. No scleral icterus.  Neck: Normal range of motion. Neck supple. No JVD present. No tracheal deviation present.  Cardiovascular: Normal rate, regular rhythm, normal heart sounds and intact distal pulses.   Pulmonary/Chest: Effort normal and breath sounds normal. No stridor. No respiratory distress. She has no decreased breath sounds. She has no wheezes. She has no rales. She exhibits tenderness.  Tenderness to right lower anterior chest wall, no signs of trauma infection  Abdominal: Soft. Bowel sounds are normal. She exhibits no distension and no mass. There is tenderness in the right upper quadrant. There is no rigidity, no rebound, no guarding, no CVA tenderness, no tenderness at McBurney's point and negative Murphy's sign.  Musculoskeletal:  Bilateral lower extremity nonpitting 1+ edema, distal pedal pulses intact  Neurological: She is alert and oriented to person, place, and time. Coordination normal.  Psychiatric:  She has a normal mood and affect. Her behavior is normal. Judgment and thought content normal.  Nursing note and vitals reviewed.   ED Course  Procedures (including critical care time) Labs Review Labs Reviewed  URINALYSIS, ROUTINE W REFLEX MICROSCOPIC (NOT AT [facility]) - Abnormal; Notable for the following:    Leukocytes, UA SMALL (*)    All other components within normal limits  CBC WITH DIFFERENTIAL/PLATELET - Abnormal; Notable for the  following:    Hemoglobin 11.0 (*)    HCT 35.0 (*)    MCV 75.8 (*)    MCH 23.8 (*)    RDW 18.3 (*)    All other components within normal limits  COMPREHENSIVE METABOLIC PANEL - Abnormal; Notable for the following:    Calcium 8.8 (*)    Albumin 3.4 (*)    ALT 10 (*)    All other components within normal limits  URINE MICROSCOPIC-ADD ON - Abnormal; Notable for the following:    Squamous Epithelial / LPF FEW (*)    All other components within normal limits  LIPASE, BLOOD  I-STAT TROPOININ, ED  I-STAT TROPOININ, ED    Imaging Review No results found.   EKG Interpretation   Date/Time:  Tuesday June 26 2014 09:28:30 EDT Ventricular Rate:  66 PR Interval:  174 QRS Duration: 101 QT Interval:  423 QTC Calculation: 443 R Axis:   58 Text Interpretation:  Normal sinus rhythm Normal ECG no significant change  since Oct 2015 Confirmed by Criss Alvine  MD, SCOTT ([id number]) on 06/26/2014  9:36:19 AM      MDM   Final diagnoses:  RUQ pain  Chest wall pain    Labs: CBC, CMP, lipase, troponin 2, urinalysis- significant for global and 11 hematocrit 35.0  Imaging: EKG- no significant findings, DG chest 2 view, right upper quadrant ultrasound no significant findings  Consults: None  Therapeutics: None  Assessment: Right upper quadrant pain, chest wall pain  Plan: Patient presents with right upper quadrant pain, labs, imaging noncontributory. Patient's pain has improved throughout her stay. Heart score 1, perc negative,  this is unlikely gallbladder, ACS, pulmonary or any other acute process that would require further evaluation in the ED. Pt is advised to follow-up with [place] wellness in 3 days, sooner as needed. Patient given strict return precautions, she verbalized understanding. Today's plan. Tylenol and ibuprofen the used for pain.     Eyvonne Mechanic, PA-C 06/26/14 1021  Eyvonne Mechanic, PA-C 06/28/14 4098  Pricilla Loveless, MD 06/28/14 [phone]

## 2015-08-20 IMAGING — US US ABDOMEN LIMITED
1 series · 14 of 25 positions shown · non-contrast
Comparison: Abdominal ultrasound 08/29/2012.

CLINICAL DATA: Right upper quadrant pain today.

EXAM:
US ABDOMEN LIMITED - RIGHT UPPER QUADRANT

[Series 1: us abdomen limited · 0.25mm/px · 14 of 45 slices shown]
[im 1/45]
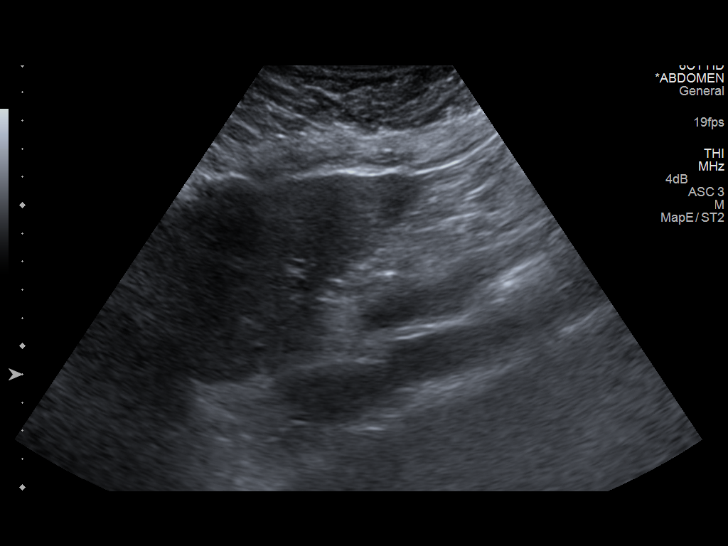
[im 4/45]
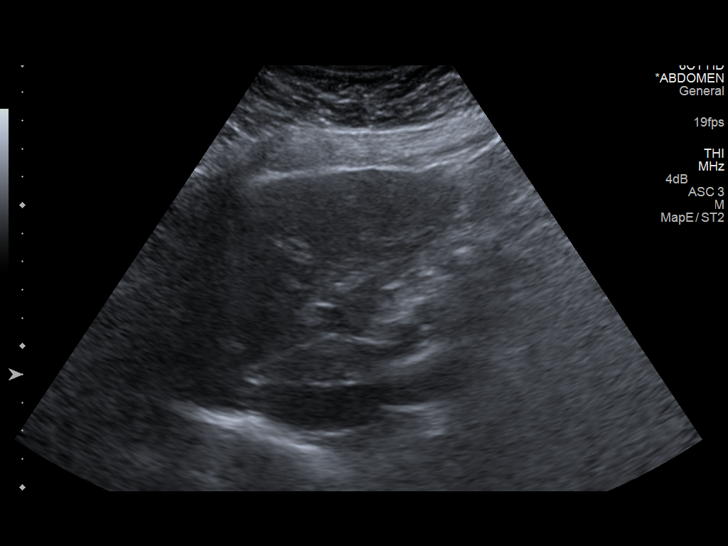
[im 8/45]
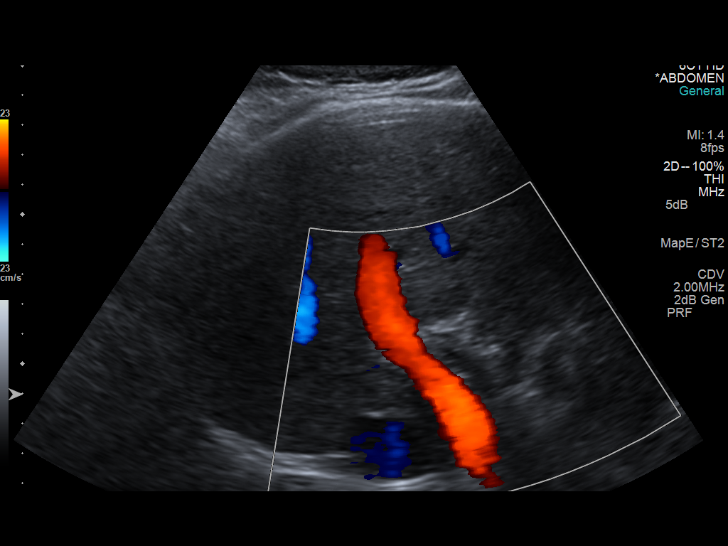
[im 12/45]
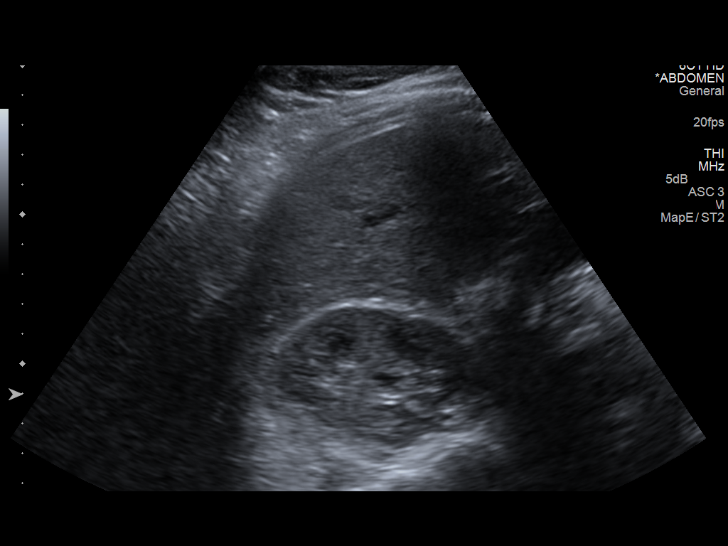
[im 15/45]
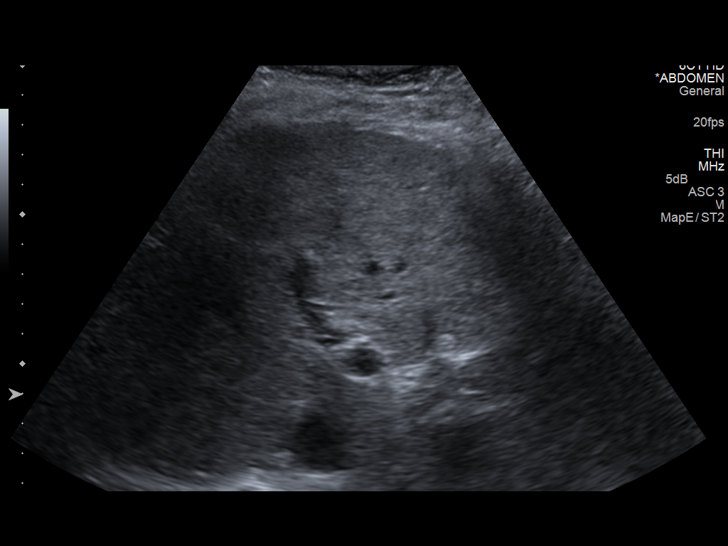
[im 17/45]
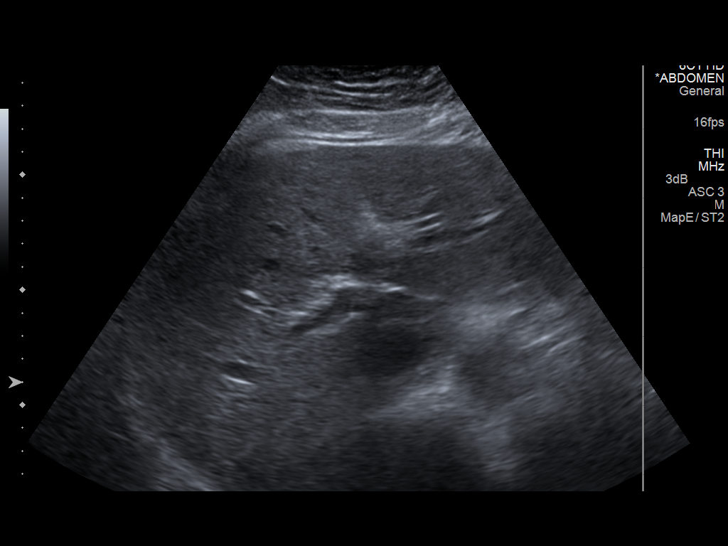
[im 21/45]
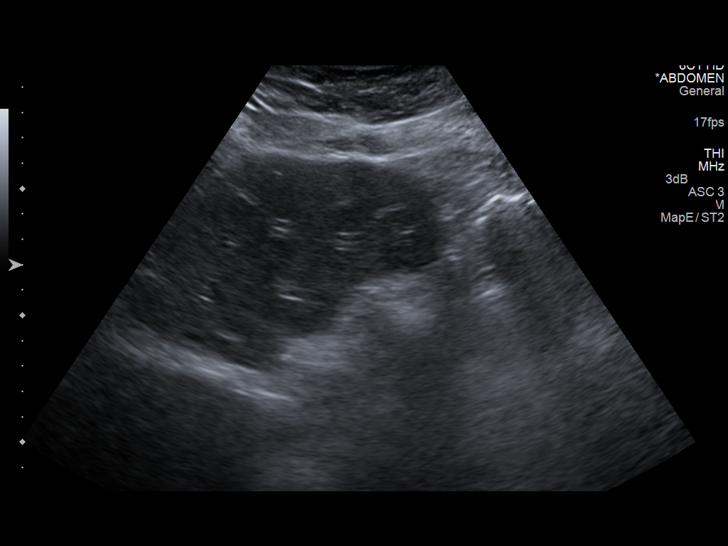
[im 24/45]
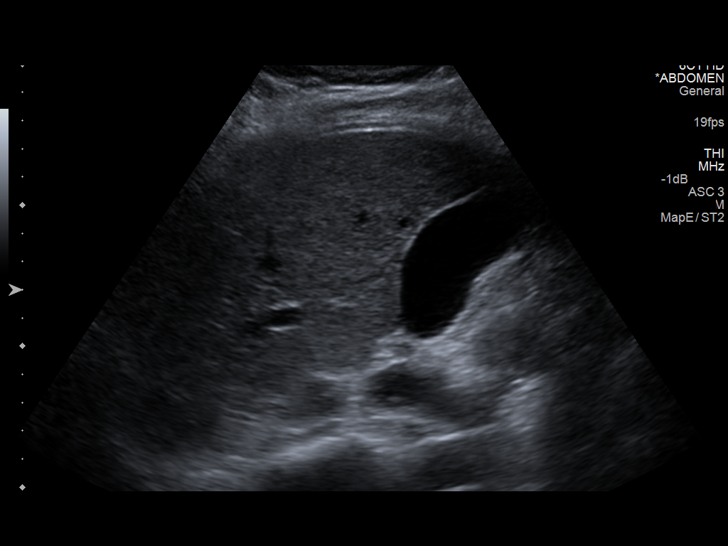
[im 28/45]
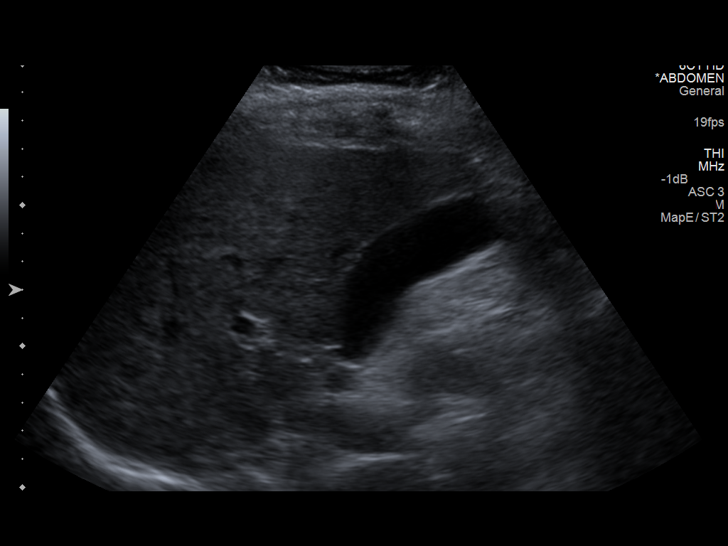
[im 30/45]
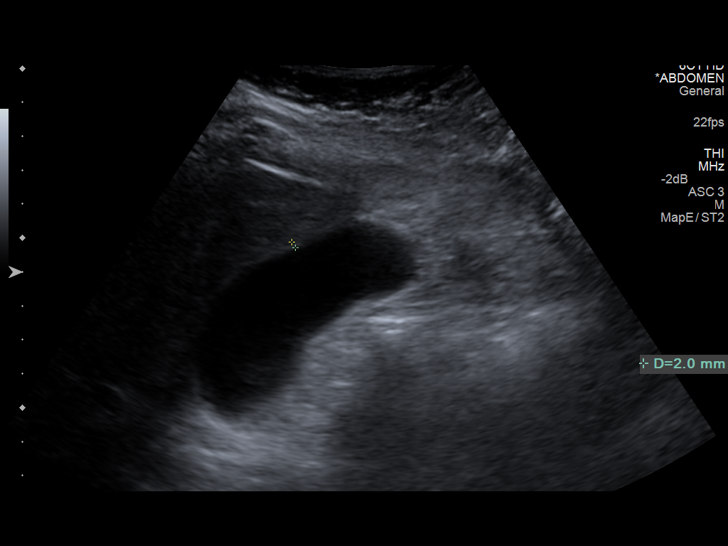
[im 34/45]
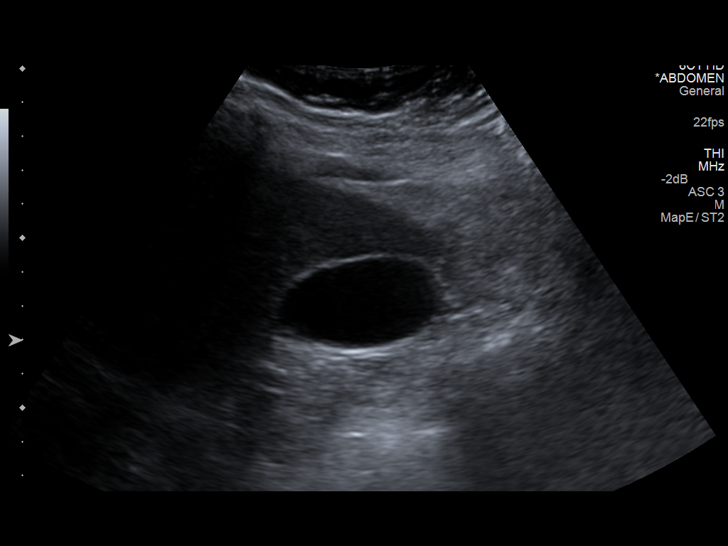
[im 37/45]
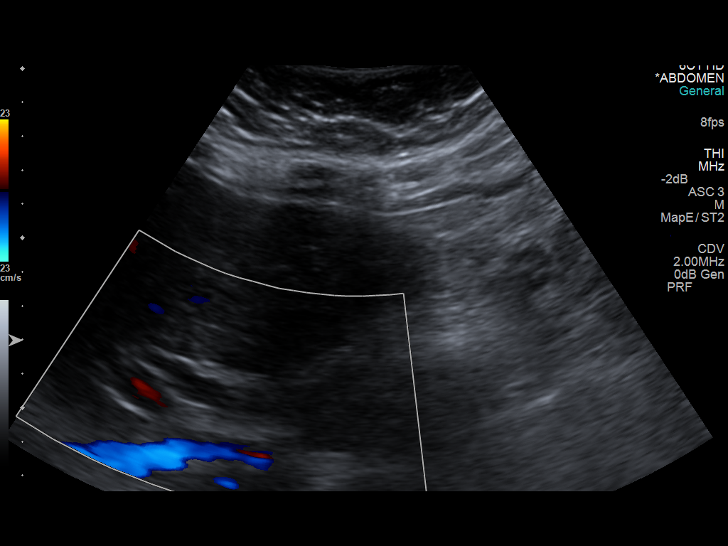
[im 41/45]
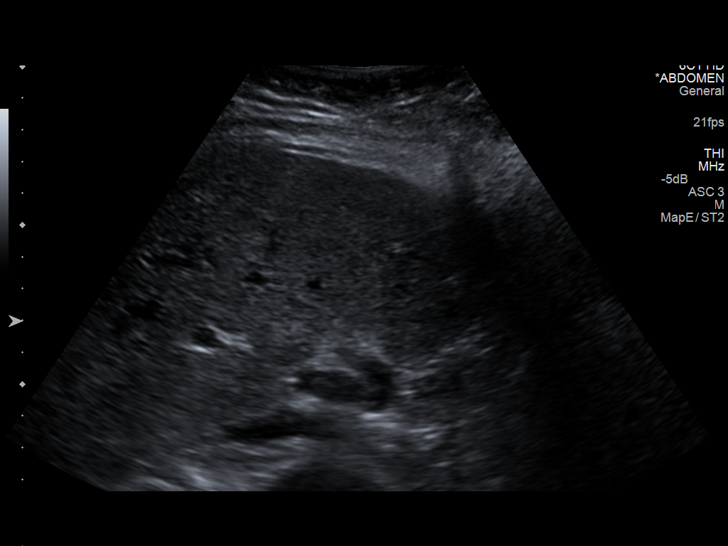
[im 45/45]
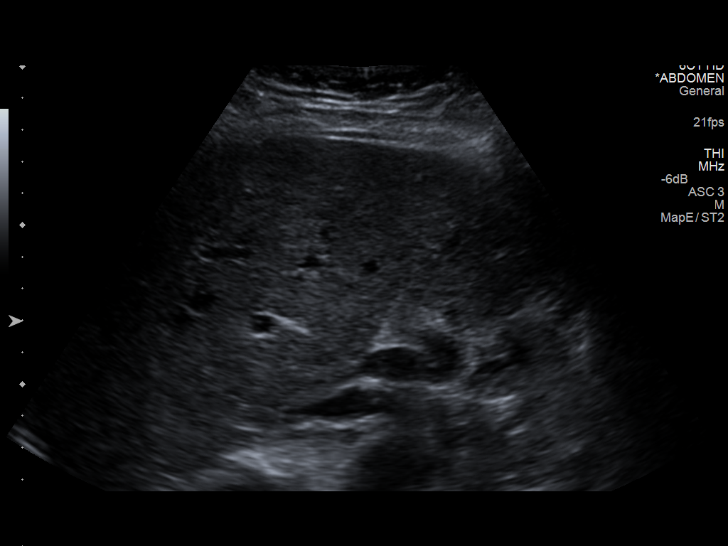

[14 of 25 positions shown; findings below may reference images not displayed]

FINDINGS: Gallbladder:

No gallstones or wall thickening visualized. No sonographic Murphy
sign noted.

Common bile duct:

Diameter: 0.2 cm

Liver:

No focal lesion identified. Within normal limits in parenchymal
echogenicity.
IMPRESSION: Negative for gallstones.  Negative exam.

## 2016-03-04 IMAGING — CR DG CHEST 2V
2 series · 2 of 2 positions shown · non-contrast
Comparison: PA and lateral chest 10/23/2013.

CLINICAL DATA: Chest pain today.

EXAM:
CHEST  2 VIEW

[chest pa]
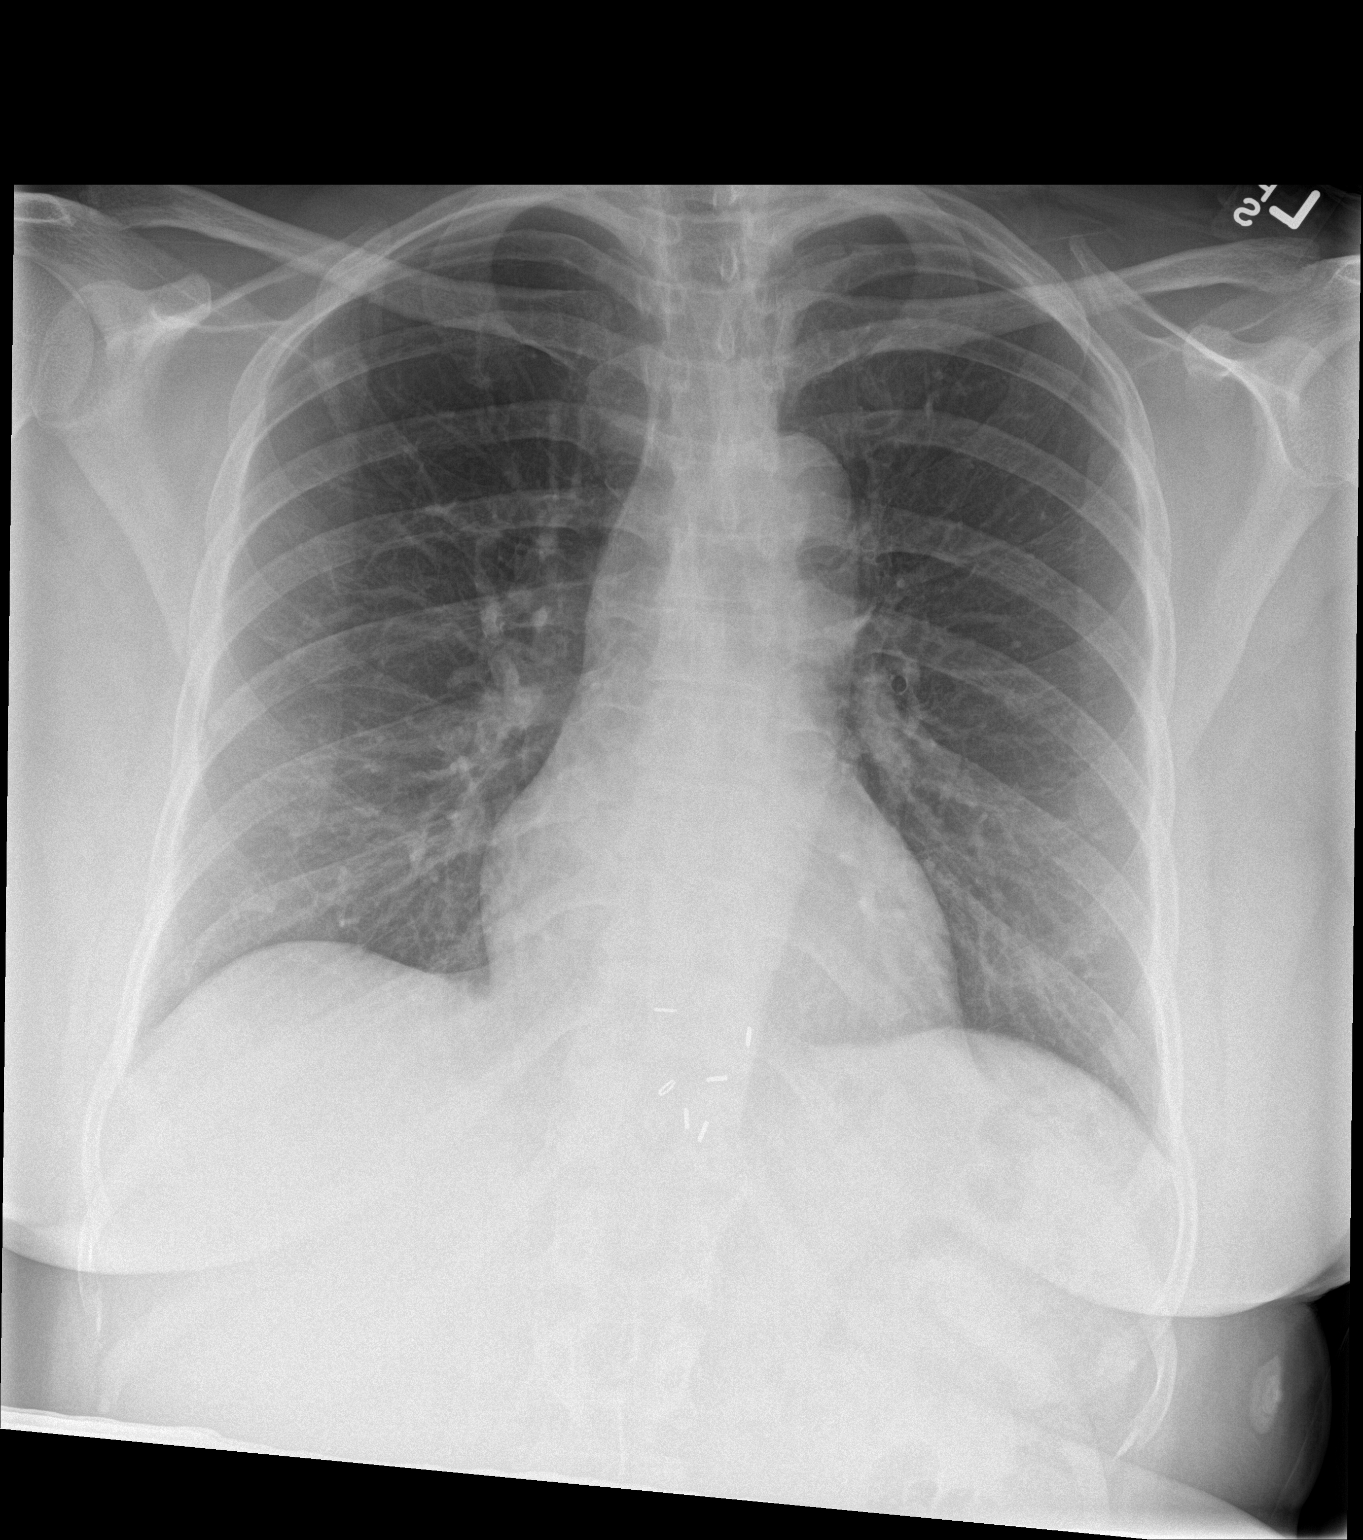

[chest lat]
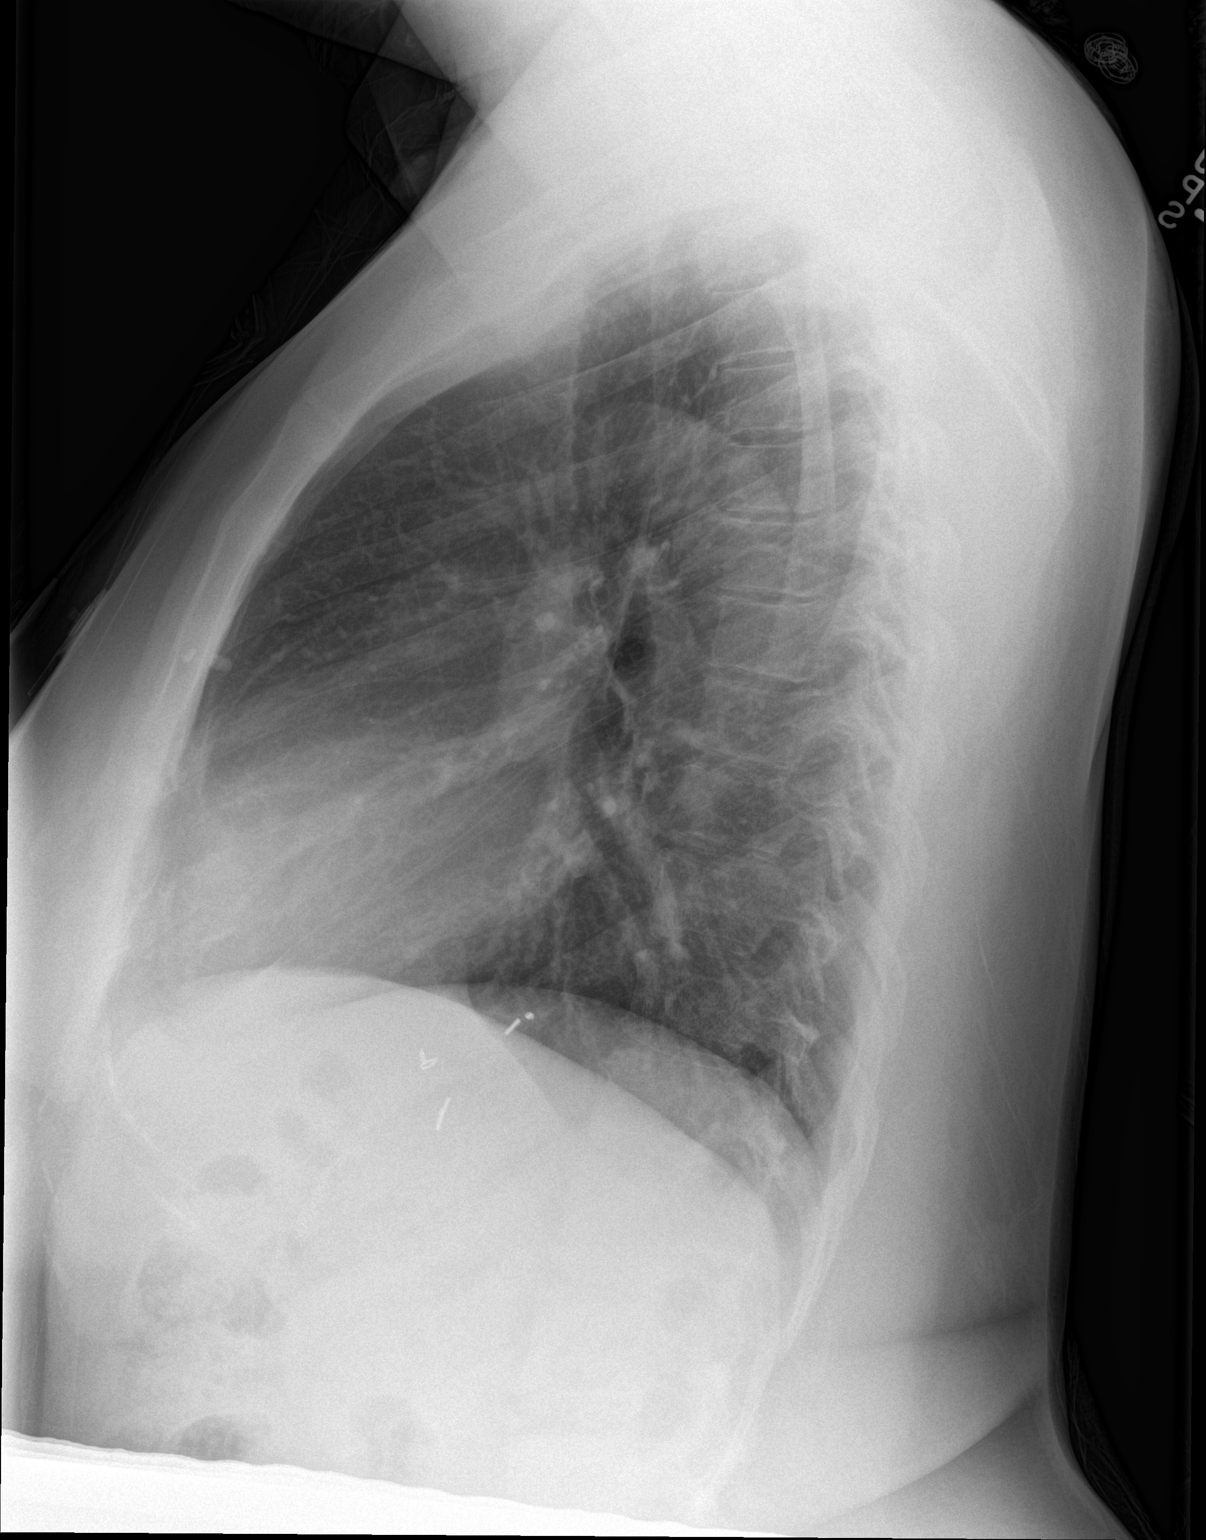

[2 of 2 positions shown; findings below may reference images not displayed]

FINDINGS: The lungs are clear. Heart size is normal. There is no pneumothorax
or pleural effusion. Surgical clips at the gastroesophageal junction
are noted and unchanged.
IMPRESSION: No acute disease.

## 2022-10-23 ENCOUNTER — Encounter (HOSPITAL_COMMUNITY): Payer: Self-pay | Admitting: Emergency Medicine

## 2022-10-23 ENCOUNTER — Other Ambulatory Visit: Payer: Self-pay

## 2022-10-23 ENCOUNTER — Emergency Department (HOSPITAL_COMMUNITY)
Admission: EM | Admit: 2022-10-23 | Discharge: 2022-10-23 | Disposition: A | Discharge status: Home / Self Care | Payer: 59 | Attending: Emergency Medicine | Admitting: Emergency Medicine

## 2022-10-23 DIAGNOSIS — Z4802 Encounter for removal of sutures: Secondary | ICD-10-CM | POA: Diagnosis present

## 2022-10-23 NOTE — ED Provider Notes (Signed)
  [place] EMERGENCY DEPARTMENT AT [facility] Provider Note   CSN: [id number] Arrival date & time: 10/23/22  1346     History  Chief Complaint  Patient presents with   Suture / Staple Removal    Sara Friedman is a 57 y.o. female.  Patient here for suture removal.  She had a carpal tunnel surgery done on 10/01/2022 reports areas been healing well but she needs to have the stitches out.  Patient had the procedure done out of town and is now living here and has not been able to have them out.  She has no complaints at this time  The history is provided by the patient.  Suture / Staple Removal       Home Medications Prior to Admission medications   Medication Sig Start Date End Date Taking? Authorizing Provider  acetaminophen (TYLENOL) 325 MG tablet Take 650 mg by mouth every 6 (six) hours as needed for headache.    [provider]  ondansetron (ZOFRAN) 4 MG tablet Take 1 tablet (4 mg total) by mouth every 6 (six) hours. 06/26/14   Eyvonne Mechanic, PA-C      Allergies    Patient has no known allergies.    Review of Systems   Review of Systems  Physical Exam Updated Vital Signs BP 136/77 (BP Location: Right Arm)   Pulse 66   Temp 97.6 F (36.4 C) (Oral)   Resp 14   SpO2 94%  Physical Exam Vitals and nursing note reviewed.  HENT:     Head: Normocephalic.  Cardiovascular:     Pulses: Normal pulses.  Pulmonary:     Effort: Pulmonary effort is normal.  Musculoskeletal:     Comments: Well-healed surgical scar with running subcuticular stitch present  Skin:    General: Skin is warm.  Neurological:     Mental Status: She is alert.     ED Results / Procedures / Treatments   Labs (all labs ordered are listed, but only abnormal results are displayed) Labs Reviewed - No data to display  EKG None  Radiology No results found.  Procedures Procedures    Medications Ordered in ED Medications - No data to display  ED Course/ Medical  Decision Making/ A&P                                 Medical Decision Making  Patient's stitches were removed.  At this time she already has started the process of follow-up with an orthopedist here in town.  No signs of infection at this time        Final Clinical Impression(s) / ED Diagnoses Final diagnoses:  Visit for suture removal    Rx / DC Orders ED Discharge Orders     None         Gwyneth Sprout, MD 10/23/22 1436

## 2022-10-23 NOTE — ED Triage Notes (Signed)
Pt here requesting removal of left wrist stitches. No signs of infection noted.

## 2022-11-03 ENCOUNTER — Other Ambulatory Visit: Payer: Self-pay

## 2022-11-03 ENCOUNTER — Emergency Department (HOSPITAL_COMMUNITY)
Admission: EM | Admit: 2022-11-03 | Discharge: 2022-11-04 | Disposition: A | Discharge status: Home / Self Care | Payer: 59 | Attending: Emergency Medicine | Admitting: Emergency Medicine

## 2022-11-03 ENCOUNTER — Emergency Department (HOSPITAL_COMMUNITY): Payer: 59

## 2022-11-03 ENCOUNTER — Encounter (HOSPITAL_COMMUNITY): Payer: Self-pay

## 2022-11-03 DIAGNOSIS — R079 Chest pain, unspecified: Secondary | ICD-10-CM | POA: Insufficient documentation

## 2022-11-03 HISTORY — DX: Cerebral infarction, unspecified: I63.9

## 2022-11-03 LAB — BASIC METABOLIC PANEL
Anion gap: 9 (ref 5–15)
BUN: 19 mg/dL (ref 6–20)
CO2: 22 mmol/L (ref 22–32)
Calcium: 9.5 mg/dL (ref 8.9–10.3)
Chloride: 108 mmol/L (ref 98–111)
Creatinine, Ser: 0.9 mg/dL (ref 0.44–1.00)
GFR, Estimated: >60 mL/min (ref >60)
Glucose, Bld: 109 mg/dL — ABNORMAL HIGH (ref 70–99)
Potassium: 3.9 mmol/L (ref 3.5–5.1)
Sodium: 139 mmol/L (ref 135–145)

## 2022-11-03 LAB — CBC
HCT: 41.9 % (ref 36.0–46.0)
Hemoglobin: 13.2 g/dL (ref 12.0–15.0)
MCH: 25.6 pg — ABNORMAL LOW (ref 26.0–34.0)
MCHC: 31.5 g/dL (ref 30.0–36.0)
MCV: 81.2 fL (ref 80.0–100.0)
Platelets: 247 10*3/uL (ref 150–400)
RBC: 5.16 MIL/uL — ABNORMAL HIGH (ref 3.87–5.11)
RDW: 16.4 % — ABNORMAL HIGH (ref 11.5–15.5)
WBC: 6.3 10*3/uL (ref 4.0–10.5)
nRBC: 0 % (ref 0.0–0.2)

## 2022-11-03 LAB — TROPONIN I (HIGH SENSITIVITY)
Troponin I (High Sensitivity): 4 ng/L (ref <18)
Troponin I (High Sensitivity): 4 ng/L (ref <18)

## 2022-11-03 NOTE — ED Provider Triage Note (Signed)
Emergency Medicine Provider Triage Evaluation Note  Sara Friedman , a 57 y.o. female  was evaluated in triage.  Pt complains of chest pain, anxiety.  Patient says that she had a stroke a few weeks ago.  Looking at her chart she was seen for Bell's palsy.  Chest pain began earlier today.  No shortness of breath..  Review of Systems   Physical Exam  LMP 03/11/2022 (Approximate)  Gen:   Awake, no distress   Resp:  Normal effort  MSK:   Moves extremities without difficulty  Other:  CV-regular rate and rhythm.  No murmur.  Neurologic-no weakness, minor left-sided facial droop.  Normal forehead raise.  Medical Decision Making  Medically screening exam initiated at 8:22 PM.  Appropriate orders placed.  Sara Friedman was informed that the remainder of the evaluation will be completed by another provider, this initial triage assessment does not replace that evaluation, and the importance of remaining in the ED until their evaluation is complete.  Chest pain workup.   Anders Simmonds T, DO 11/03/22 2023

## 2022-11-03 NOTE — ED Triage Notes (Signed)
Pt arrived from home via [facility] c/o chest pain 10/10 sharp pains in left chest. Denies sob, dizziness, diaphoresis. Pt states that she is very stressed out with her current living arrangements. Pt states hx of stroke 3 weeks ago.

## 2022-11-04 MED ORDER — ACETAMINOPHEN 500 MG PO TABS
1000.0000 mg | ORAL_TABLET | Freq: Once | ORAL | Status: AC
  Administered 2022-11-04: 1000 mg via ORAL
  Filled 2022-11-04: qty 2

## 2022-11-04 MED ORDER — ALUM & MAG HYDROXIDE-SIMETH 200-200-20 MG/5ML PO SUSP
30.0000 mL | Freq: Once | ORAL | Status: AC
  Administered 2022-11-04: 30 mL via ORAL
  Filled 2022-11-04: qty 30

## 2022-11-04 NOTE — Discharge Instructions (Signed)
You were evaluated in the Emergency Department and after careful evaluation, we did not find any emergent condition requiring admission or further testing in the hospital.  Your exam/testing today is overall reassuring.  Recommend follow-up with primary care doctor and/or cardiologist to discuss your symptoms.  Please return to the Emergency Department if you experience any worsening of your condition.   Thank you for allowing [place] to be a part of your care.

## 2022-11-04 NOTE — ED Provider Notes (Signed)
[facility] Emergency Department Provider Note MRN:  [id number]  Arrival date & time: 11/04/22     Chief Complaint   Chest Pain   History of Present Illness   Sara Friedman is a 57 y.o. year-old female with no pertinent past medical history presenting to the ED with chief complaint of chest pain.  Pain in the left side of the chest described as sharp, happened at night while trying to sleep.  Has been happening off and on for the past few days.  Going through a lot of stressful things recently.  Says she had a stroke recently but actually it was Bell's palsy.  Review of Systems  A thorough review of systems was obtained and all systems are negative except as noted in the HPI and PMH.   Patient's Health History    Past Medical History:  Diagnosis Date   Bradycardia        Ulcer     Past Surgical History:  Procedure Laterality Date   ANKLE SURGERY     ulcer surgery      History reviewed. No pertinent family history.  Social History   Socioeconomic History   Marital status: Single    Spouse name: Not on file   Number of children: Not on file   Years of education: Not on file   Highest education level: Not on file  Occupational History   Not on file  Tobacco Use   Smoking status: Never   Smokeless tobacco: Not on file  Substance and Sexual Activity   Alcohol use: No   Drug use: No   Sexual activity: Not on file  Other Topics Concern   Not on file  Social History Narrative   Not on file   Social Determinants of Health   Financial Resource Strain: Low Risk  (01/31/2019)   Received from [facility]   Overall Financial Resource Strain (CARDIA)    Difficulty of Paying Living Expenses: Not hard at all  Food Insecurity: No Food Insecurity (01/31/2019)   Received from [facility]   Hunger Vital Sign    Worried About Running Out of Food in the Last Year: Never true    Ran Out of Food in the Last Year: Never true  Transportation Needs:  No Transportation Needs (01/31/2019)   Received from [facility]   PRAPARE - Transportation    Lack of Transportation (Medical): No    Lack of Transportation (Non-Medical): No  Physical Activity: Not on file  Stress: Not on file  Social Connections: Not on file  Intimate Partner Violence: Not on file     Physical Exam   Vitals:   11/03/22 2026 11/04/22 0035  BP: 115/83 116/75  Pulse: 73 62  Resp: 18 20  Temp: 98.1 F (36.7 C) (!) 97.3 F (36.3 C)  SpO2: 97% 99%    CONSTITUTIONAL: Well-appearing, NAD NEURO/PSYCH:  Alert and oriented x 3, no focal deficits EYES:  eyes equal and reactive ENT/NECK:  no LAD, no JVD CARDIO: Regular rate, well-perfused, normal S1 and S2 PULM:  CTAB no wheezing or rhonchi GI/GU:  non-distended, non-tender MSK/SPINE:  No gross deformities, no edema SKIN:  no rash, atraumatic   *Additional and/or pertinent findings included in MDM below  Diagnostic and Interventional Summary    EKG Interpretation Date/Time:  Tuesday November 03 2022 20:31:23 EDT Ventricular Rate:  74 PR Interval:  168 QRS Duration:  90 QT Interval:  400 QTC Calculation: 444 R Axis:  78  Text Interpretation: Normal sinus rhythm Normal ECG When compared with ECG of 26-Jun-2014 09:28, PREVIOUS ECG IS PRESENT Confirmed by Kennis Carina [phone]) on 11/04/2022 12:12:36 AM       Labs Reviewed  BASIC METABOLIC PANEL - Abnormal; Notable for the following components:      Result Value   Glucose, Bld 109 (*)    All other components within normal limits  CBC - Abnormal; Notable for the following components:   RBC 5.16 (*)    MCH 25.6 (*)    RDW 16.4 (*)    All other components within normal limits  TROPONIN I (HIGH SENSITIVITY)  TROPONIN I (HIGH SENSITIVITY)    DG Chest 1 View  Final Result      Medications  alum & mag hydroxide-simeth (MAALOX/MYLANTA) 200-200-20 MG/5ML suspension 30 mL (30 mLs Oral Given 11/04/22 0030)  acetaminophen (TYLENOL) tablet 1,000 mg  (1,000 mg Oral Given 11/04/22 0030)     Procedures  /  Critical Care Procedures  ED Course and Medical Decision Making  Initial Impression and Ddx ACS is considered but favoring noncardiac causes such as MSK, GERD, stress.  Highly doubt PE as there is no tachycardia or hypoxia or shortness of breath or leg pain or swelling.  Past medical/surgical history that increases complexity of ED encounter: None, recent ED visit at outside hospital for Bell's palsy though patient describes it as a stroke.  Interpretation of Diagnostics I personally reviewed the EKG and my interpretation is as follows: Sinus rhythm without concerning ischemic features  Labs reassuring with no significant blood count or electrolyte disturbance, troponin negative x 2  Patient Reassessment and Ultimate Disposition/Management     No emergent process appropriate for discharge.  Patient management required discussion with the following services or consulting groups:  None  Complexity of Problems Addressed Acute illness or injury that poses threat of life of bodily function  Additional Data Reviewed and Analyzed Further history obtained from: Care Everywhere and Prior labs/imaging results  Additional Factors Impacting ED Encounter Risk None  Elmer Sow. Pilar Plate, MD [facility] Emergency Medicine [facility] [email or web address]  Final Clinical Impressions(s) / ED Diagnoses     ICD-10-CM   1. Chest pain, unspecified type  R07.9       ED Discharge Orders     None        Discharge Instructions Discussed with and Provided to Patient:    Discharge Instructions      You were evaluated in the Emergency Department and after careful evaluation, we did not find any emergent condition requiring admission or further testing in the hospital.  Your exam/testing today is overall reassuring.  Recommend follow-up with primary care doctor and/or cardiologist to discuss your symptoms.  Please  return to the Emergency Department if you experience any worsening of your condition.   Thank you for allowing [place] to be a part of your care.      Sabas Sous, MD 11/04/22 [phone]

## 2022-11-18 ENCOUNTER — Other Ambulatory Visit: Payer: Self-pay

## 2022-11-18 ENCOUNTER — Emergency Department (HOSPITAL_COMMUNITY)
Admission: EM | Admit: 2022-11-18 | Discharge: 2022-11-18 | Disposition: A | Discharge status: Home / Self Care | Payer: 59 | Attending: Student | Admitting: Student

## 2022-11-18 DIAGNOSIS — M79642 Pain in left hand: Secondary | ICD-10-CM

## 2022-11-18 DIAGNOSIS — Z8673 Personal history of transient ischemic attack (TIA), and cerebral infarction without residual deficits: Secondary | ICD-10-CM | POA: Insufficient documentation

## 2022-11-18 DIAGNOSIS — M25532 Pain in left wrist: Secondary | ICD-10-CM | POA: Insufficient documentation

## 2022-11-18 MED ORDER — KETOROLAC TROMETHAMINE 15 MG/ML IJ SOLN
15.0000 mg | Freq: Once | INTRAMUSCULAR | Status: AC
  Administered 2022-11-18: 15 mg via INTRAMUSCULAR
  Filled 2022-11-18: qty 1

## 2022-11-18 MED ORDER — NAPROXEN 375 MG PO TABS: 375.0000 mg | ORAL_TABLET | Freq: Two times a day (BID) | ORAL | 0 refills | Status: AC

## 2022-11-18 MED ORDER — NAPROXEN 375 MG PO TABS: 375.0000 mg | ORAL_TABLET | Freq: Two times a day (BID) | ORAL | 0 refills | Status: DC

## 2022-11-18 MED ORDER — DEXAMETHASONE SODIUM PHOSPHATE 10 MG/ML IJ SOLN
8.0000 mg | Freq: Once | INTRAMUSCULAR | Status: AC
  Administered 2022-11-18: 8 mg via INTRAMUSCULAR
  Filled 2022-11-18: qty 1

## 2022-11-18 MED ORDER — METHYLPREDNISOLONE 4 MG PO TBPK: ORAL_TABLET | ORAL | 0 refills | Status: AC

## 2022-11-18 MED ORDER — METHYLPREDNISOLONE 4 MG PO TBPK: ORAL_TABLET | ORAL | 0 refills | Status: DC

## 2022-11-18 NOTE — Progress Notes (Signed)
Orthopedic Tech Progress Note Patient Details:  Sara Friedman [birth year] [id number]  Ortho Devices Type of Ortho Device: Velcro wrist splint Ortho Device/Splint Location: Left wrist Ortho Device/Splint Interventions: Application   Post Interventions Patient Tolerated: Well  Sara Friedman 11/18/2022, 12:18 PM

## 2022-11-18 NOTE — ED Triage Notes (Signed)
Pt. Stated, I had carpal tunnel surgery 10-04-2022 , this am I started having left arm pain and swelling and my left hand and left thumb is so painful

## 2022-11-18 NOTE — ED Notes (Signed)
Pt was complaining of left arm pain.  She stated she had carpal tunnel surgery in September and that she woke up this morning with significant pain.  Pt denied any recent trauma.

## 2022-11-18 NOTE — ED Provider Notes (Signed)
[place] EMERGENCY DEPARTMENT AT [facility] Provider Note  CSN: [id number] Arrival date & time: 11/18/22 1054  Chief Complaint(s) Arm Pain and Hand Pain  HPI Sara Friedman is a 57 y.o. female who presents emergency department for evaluation of left hand and arm pain.  She states that she has a history of severe carpal tunnel requiring surgical release and had a wrist surgery in September 2024.  She said over the last week she has been lifting a lot of heavy boxes and is concerned this may have exacerbated her pain.  She arrives with significant pain over the wrist with persistent numbness and tingling in the median nerve distribution.  States that she has a follow-up next week with orthopedic surgeon but is having significant out of pain and is requesting pain control and bracing today.  Denies associated fevers, nausea, vomiting, chest pain, shortness of breath or any other systemic symptoms.   Past Medical History Past Medical History:  Diagnosis Date   Bradycardia    Stroke (HCC)    Ulcer    There are no problems to display for this patient.  Home Medication(s) Prior to Admission medications   Medication Sig Start Date End Date Taking? Authorizing Provider  acetaminophen (TYLENOL) 325 MG tablet Take 650 mg by mouth every 6 (six) hours as needed for headache.    [provider]  ondansetron (ZOFRAN) 4 MG tablet Take 1 tablet (4 mg total) by mouth every 6 (six) hours. 06/26/14   Eyvonne Mechanic, PA-C                                                                                                                                    Past Surgical History Past Surgical History:  Procedure Laterality Date   ANKLE SURGERY     ulcer surgery     Family History No family history on file.  Social History Social History   Tobacco Use   Smoking status: Never  Substance Use Topics   Alcohol use: No   Drug use: No   Allergies Patient has no known  allergies.  Review of Systems Review of Systems  Musculoskeletal:  Positive for arthralgias and joint swelling.    Physical Exam Vital Signs  I have reviewed the triage vital signs BP 132/84 (BP Location: Right Arm)   Pulse 65   Temp 97.8 F (36.6 C) (Oral)   Resp 17   LMP  (LMP Unknown)   SpO2 97%   Physical Exam Vitals and nursing note reviewed.  Constitutional:      General: She is not in acute distress.    Appearance: She is well-developed.  HENT:     Head: Normocephalic and atraumatic.  Eyes:     Conjunctiva/sclera: Conjunctivae normal.  Cardiovascular:     Rate and Rhythm: Normal rate and regular rhythm.     Heart sounds: No murmur heard. Pulmonary:  Effort: Pulmonary effort is normal. No respiratory distress.     Breath sounds: Normal breath sounds.  Abdominal:     Palpations: Abdomen is soft.     Tenderness: There is no abdominal tenderness.  Musculoskeletal:        General: Swelling and tenderness present.     Cervical back: Neck supple.  Skin:    General: Skin is warm and dry.     Capillary Refill: Capillary refill takes less than 2 seconds.  Neurological:     Mental Status: She is alert.  Psychiatric:        Mood and Affect: Mood normal.    ED Results and Treatments Labs (all labs ordered are listed, but only abnormal results are displayed) Labs Reviewed - No data to display                                                                                                                        Radiology No results found.  Pertinent labs & imaging results that were available during my care of the patient were reviewed by me and considered in my medical decision making (see MDM for details).  Medications Ordered in ED Medications  ketorolac (TORADOL) 15 MG/ML injection 15 mg (15 mg Intramuscular Given 11/18/22 1207)  dexamethasone (DECADRON) injection 8 mg (8 mg Intramuscular Given 11/18/22 1206)                                                                                                                                      Procedures .Ortho Injury Treatment  Date/Time: 11/19/2022 11:09 AM  Performed by: Glendora Score, MD Authorized by: Glendora Score, MD   Consent:    Consent obtained:  Verbal   Consent given by:  Patient   Risks discussed:  Fracture, nerve damage, restricted joint movement and vascular damage   Alternatives discussed:  No treatment and alternative treatmentInjury location: wrist Location details: left wrist Pre-procedure distal perfusion: normal Pre-procedure neurological function: normal Pre-procedure range of motion: reduced Immobilization: brace Splint type: radial gutter Splint Applied by: ED Nurse Post-procedure distal perfusion: normal Post-procedure neurological function: normal Post-procedure range of motion: unchanged    (including critical care time)  Medical Decision Making / ED Course   This patient presents to the ED for concern of wrist pain, this involves an extensive number of treatment options, and is a complaint that carries with it a  high risk of complications and morbidity.  The differential diagnosis includes wrist sprain, compressive neuropraxia, fracture, soft tissue infection  MDM: Patient seen emergency room for evaluation of wrist pain and swelling.  Physical exam with tenderness over the wrist and subjective sensory deficit in the median nerve distribution.  She states that this sensory deficit has been present since surgery and is not a new symptom today.  There is no overlying erythema, limb is not warm to touch and she does have range of motion at the wrist.  I have lower suspicion for septic joint or soft tissue skin infection.  Vital signs are stable and patient is not febrile. patient has suffered no trauma to the wrist and I have low suspicion for fracture and thus x-ray imaging deferred.  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  On reevaluation, Patient states her symptoms have improved and thus we will discharge the patient on Medrol Dosepak and Naprosyn for pain control to bridge the patient to her scheduled hand surgery appointment.  Given return precautions of which she voiced understanding and patient discharged.   Additional history obtained:  -External records from outside source obtained and reviewed including: Chart review including previous notes, labs, imaging, consultation notes   Medicines ordered and prescription drug management: Meds ordered this encounter  Medications   ketorolac (TORADOL) 15 MG/ML injection 15 mg   dexamethasone (DECADRON) injection 8 mg    -I have reviewed the patients home medicines and have made adjustments as needed  Critical interventions none   Social Determinants of Health:  Factors impacting patients care include: Has outpatient hand surgery follow-up in 1 week   Reevaluation: After the interventions noted above, I reevaluated the patient and found that they have :improved  Co morbidities that complicate the patient evaluation  Past Medical History:  Diagnosis Date   Bradycardia    Stroke (HCC)    Ulcer       Dispostion: I considered admission for this patient, but at this time she does not meet inpatient criteria for admission and she is safe for discharge with outpatient follow-up with strict return precautions of which she voiced understanding.     Final Clinical Impression(s) / ED Diagnoses Final diagnoses:  None     @PCDICTATION @    Glendora Score, MD 11/19/22 1114
